# Patient Record
Sex: Male | Born: 1985 | Race: White | Hispanic: No | Marital: Married | Smoking: Never smoker
Health system: Southern US, Community
[De-identification: ages and names within clinical notes are randomized; demographics above are authoritative.]

## PROBLEM LIST (undated history)

## (undated) DIAGNOSIS — F909 Attention-deficit hyperactivity disorder, unspecified type: Secondary | ICD-10-CM

## (undated) HISTORY — PX: OTHER SURGICAL HISTORY: SHX169

## (undated) HISTORY — DX: Attention-deficit hyperactivity disorder, unspecified type: F90.9

---
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
## 2009-01-08 ENCOUNTER — Inpatient Hospital Stay (HOSPITAL_COMMUNITY): Admission: EM | Admit: 2009-01-08 | Discharge: 2009-01-09 | Payer: Self-pay | Admitting: Emergency Medicine

## 2009-07-11 ENCOUNTER — Emergency Department (HOSPITAL_COMMUNITY): Admission: EM | Admit: 2009-07-11 | Discharge: 2009-07-11 | Payer: Self-pay | Admitting: Emergency Medicine

## 2009-07-15 ENCOUNTER — Ambulatory Visit (HOSPITAL_COMMUNITY): Admission: RE | Admit: 2009-07-15 | Discharge: 2009-07-16 | Payer: Self-pay | Admitting: Orthopaedic Surgery

## 2010-06-17 ENCOUNTER — Encounter: Payer: Self-pay | Admitting: Emergency Medicine
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
## 2010-08-16 LAB — CBC
HCT: 46.8 % (ref 39.0–52.0)
Hemoglobin: 16.5 g/dL (ref 13.0–17.0)
MCV: 93.8 fL (ref 78.0–100.0)
Platelets: 178 10*3/uL (ref 150–400)
WBC: 5.1 10*3/uL (ref 4.0–10.5)

## 2010-09-03 LAB — CBC
HCT: 40.6 % (ref 39.0–52.0)
MCHC: 35.2 g/dL (ref 30.0–36.0)
MCV: 93.9 fL (ref 78.0–100.0)
RBC: 4.32 MIL/uL (ref 4.22–5.81)

## 2010-09-03 LAB — DIFFERENTIAL
Basophils Relative: 1 % (ref 0–1)
Eosinophils Absolute: 0.1 10*3/uL (ref 0.0–0.7)
Eosinophils Relative: 1 % (ref 0–5)
Lymphs Abs: 2.1 10*3/uL (ref 0.7–4.0)
Monocytes Absolute: 0.5 10*3/uL (ref 0.1–1.0)
Monocytes Relative: 6 % (ref 3–12)
Neutrophils Relative %: 67 % (ref 43–77)

## 2010-09-03 LAB — POCT I-STAT, CHEM 8
BUN: 11 mg/dL (ref 6–23)
Calcium, Ion: 1.11 mmol/L — ABNORMAL LOW (ref 1.12–1.32)
Chloride: 106 mEq/L (ref 96–112)
Creatinine, Ser: 0.9 mg/dL (ref 0.4–1.5)
Glucose, Bld: 119 mg/dL — ABNORMAL HIGH (ref 70–99)
Potassium: 3.7 mEq/L (ref 3.5–5.1)

## 2010-09-03 LAB — PROTIME-INR: INR: 1 (ref 0.00–1.49)
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
## 2010-10-19 ENCOUNTER — Inpatient Hospital Stay (HOSPITAL_COMMUNITY): Payer: 59

## 2010-10-19 ENCOUNTER — Inpatient Hospital Stay (HOSPITAL_COMMUNITY)
Admission: EM | Admit: 2010-10-19 | Discharge: 2010-10-20 | DRG: 497 | Disposition: A | Discharge status: Home / Self Care | Payer: 59 | Attending: Orthopaedic Surgery | Admitting: Orthopaedic Surgery

## 2010-10-19 ENCOUNTER — Emergency Department (HOSPITAL_COMMUNITY): Payer: 59

## 2010-10-19 DIAGNOSIS — S52309A Unspecified fracture of shaft of unspecified radius, initial encounter for closed fracture: Principal | ICD-10-CM | POA: Diagnosis present

## 2010-10-19 DIAGNOSIS — Y998 Other external cause status: Secondary | ICD-10-CM

## 2010-10-19 LAB — APTT: aPTT: 24 seconds (ref 24–37)

## 2010-10-19 LAB — CBC
HCT: 42.5 % (ref 39.0–52.0)
MCH: 32.3 pg (ref 26.0–34.0)
MCV: 91 fL (ref 78.0–100.0)
Platelets: 188 10*3/uL (ref 150–400)
RDW: 12.5 % (ref 11.5–15.5)
WBC: 12.2 10*3/uL — ABNORMAL HIGH (ref 4.0–10.5)

## 2010-10-19 LAB — COMPREHENSIVE METABOLIC PANEL
AST: 21 U/L (ref 0–37)
Albumin: 4 g/dL (ref 3.5–5.2)
Alkaline Phosphatase: 50 U/L (ref 39–117)
BUN: 10 mg/dL (ref 6–23)
CO2: 29 mEq/L (ref 19–32)
Chloride: 106 mEq/L (ref 96–112)
Creatinine, Ser: 0.7 mg/dL (ref 0.4–1.5)
GFR calc non Af Amer: >60 mL/min (ref >60)
Potassium: 4 mEq/L (ref 3.5–5.1)
Total Bilirubin: 0.2 mg/dL — ABNORMAL LOW (ref 0.3–1.2)

## 2010-10-19 LAB — DIFFERENTIAL
Eosinophils Absolute: 0 10*3/uL (ref 0.0–0.7)
Eosinophils Relative: 0 % (ref 0–5)
Lymphocytes Relative: 12 % (ref 12–46)
Lymphs Abs: 1.5 10*3/uL (ref 0.7–4.0)
Monocytes Absolute: 0.6 10*3/uL (ref 0.1–1.0)
Monocytes Relative: 5 % (ref 3–12)
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
## 2011-01-04 IMAGING — CR DG FOREARM 2V*R*
2 series · 2 of 2 positions shown · non-contrast
Comparison: 01/08/2009

CLINICAL DATA: Fall with right forearm pain.  History of previous
forearm fracture.

RIGHT FOREARM - 2 VIEW

[x forearm ap right]
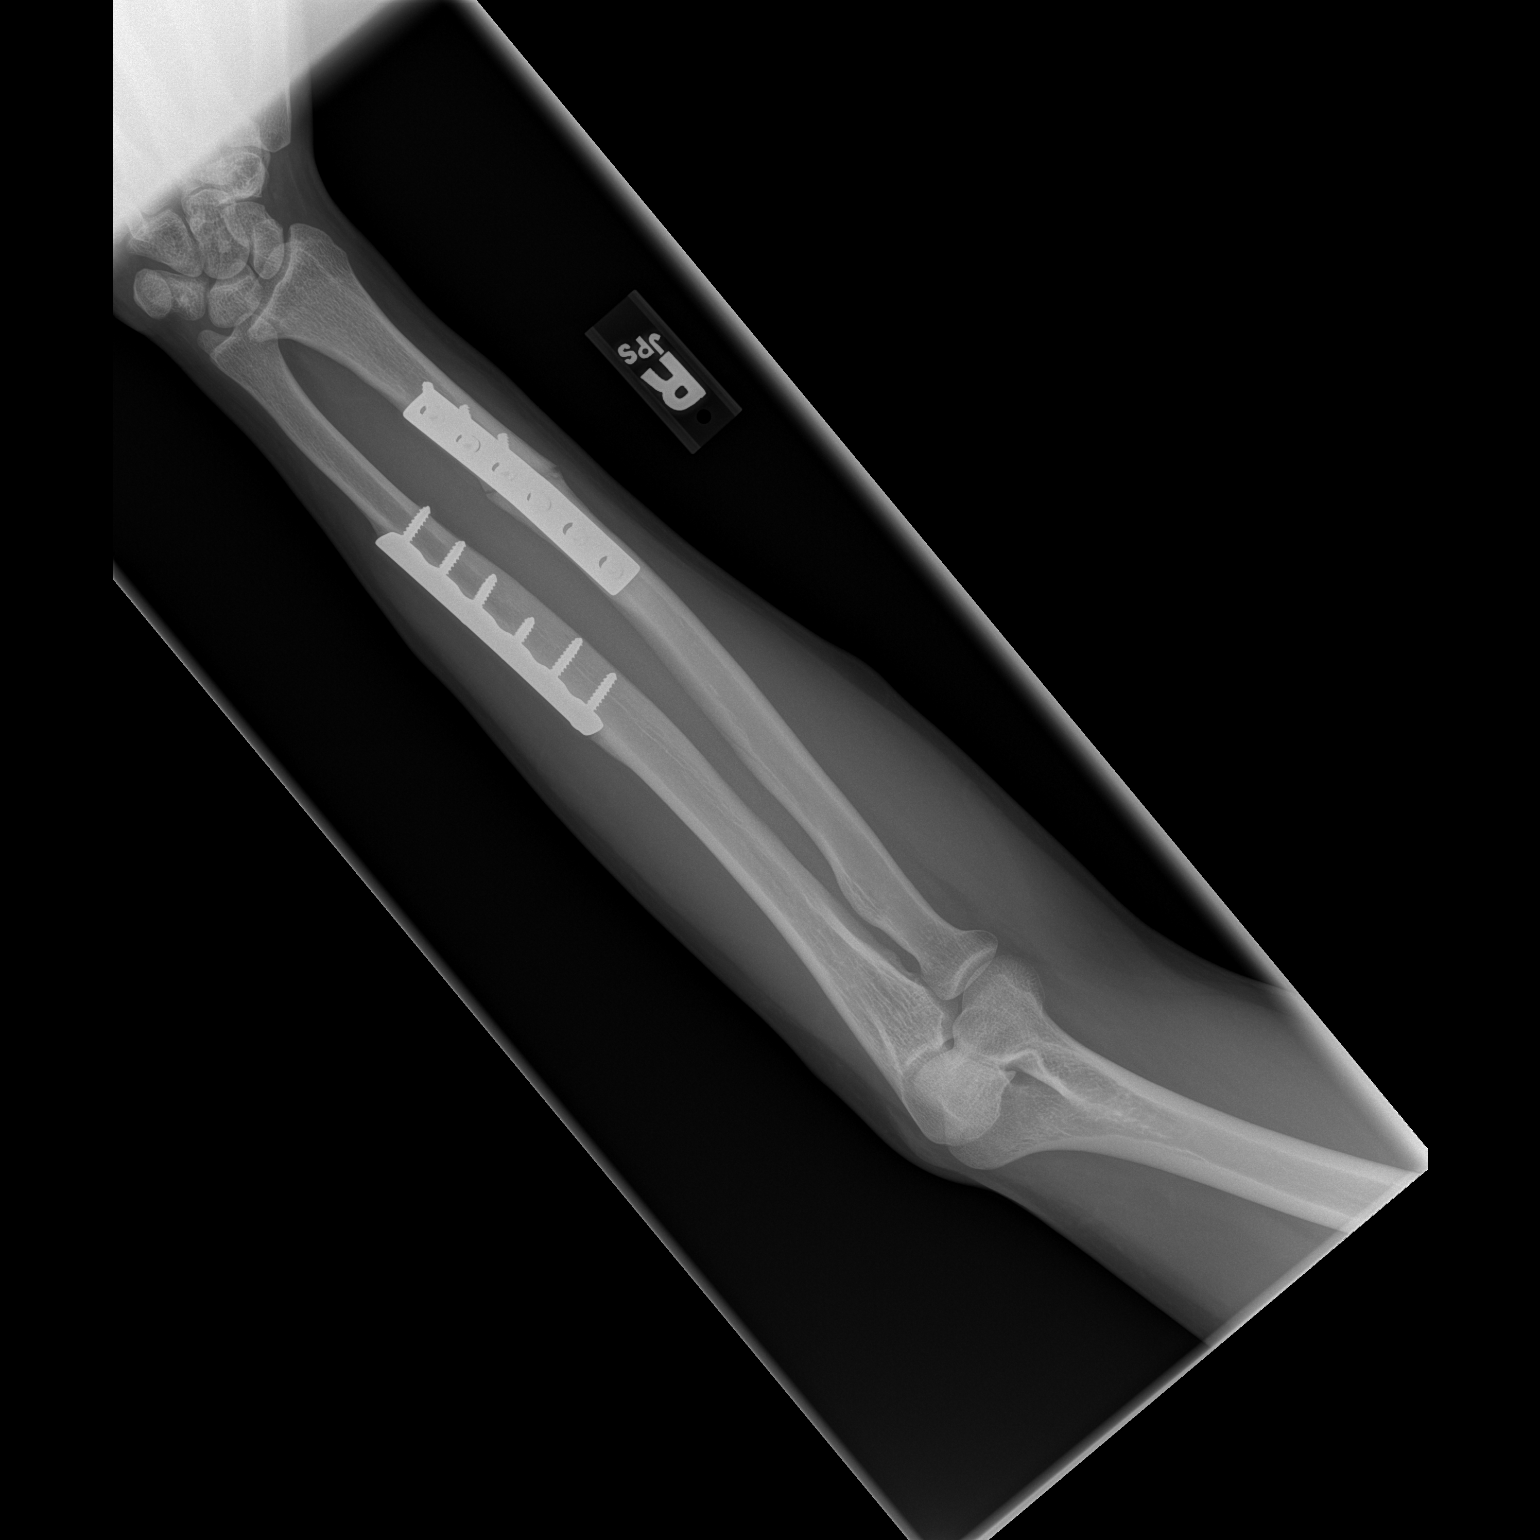

[x forearm lat right]
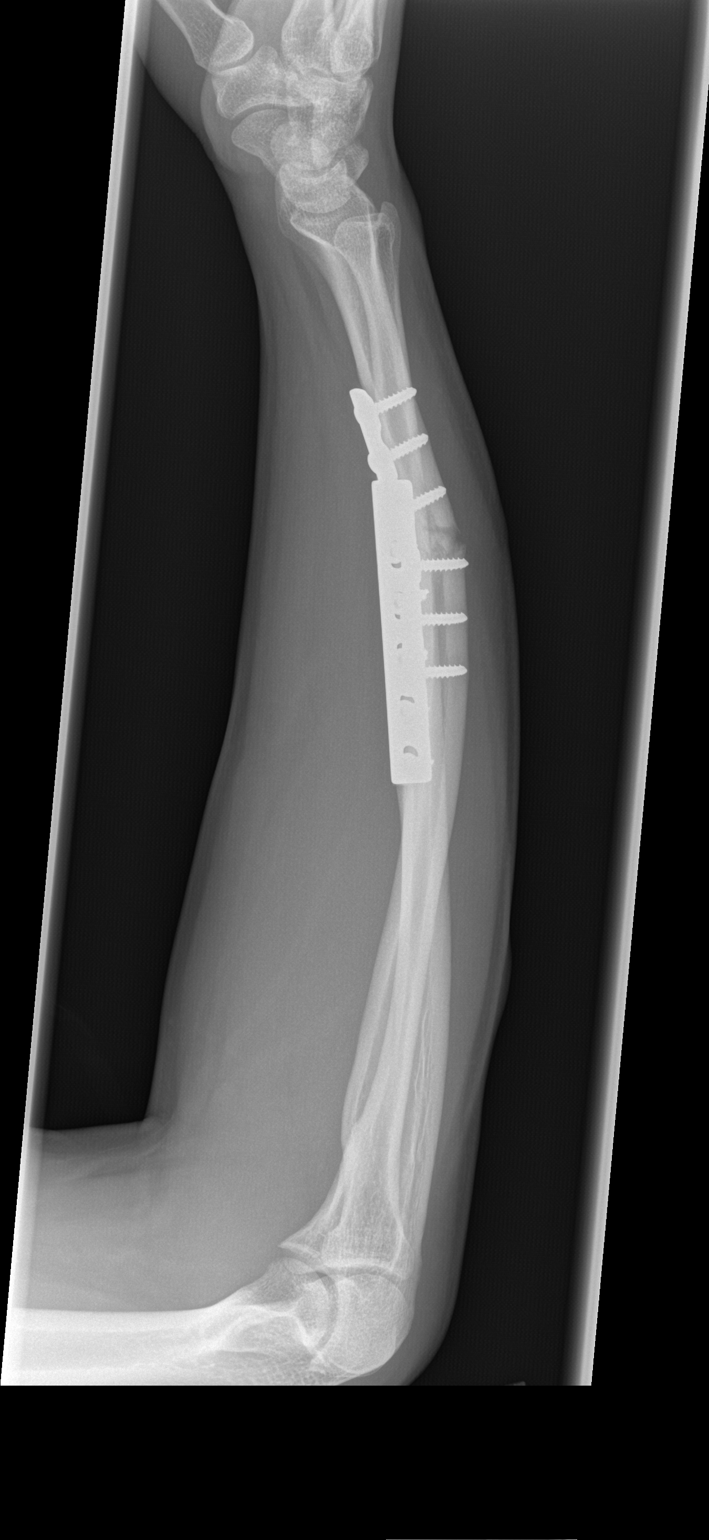

[2 of 2 positions shown; findings below may reference images not displayed]

FINDINGS: Internal plate screw fixation devices overlie the mid -
distal radius and ulna.
A fracture line is present through the mid - distal radius.
There is no evidence of subluxation or dislocation.
IMPRESSION: Mid - distal radial fracture with overlying internal plate and
screw fixation.  This fracture line may represent an ununited
fracture, but a new/acute fracture component is difficult to
exclude.  Correlate clinically.

## 2011-03-14 ENCOUNTER — Ambulatory Visit (HOSPITAL_COMMUNITY): Payer: 59 | Admitting: Physician Assistant

## 2011-03-14 DIAGNOSIS — F909 Attention-deficit hyperactivity disorder, unspecified type: Secondary | ICD-10-CM

## 2011-05-15 ENCOUNTER — Other Ambulatory Visit (HOSPITAL_COMMUNITY): Payer: Self-pay | Admitting: Physician Assistant

## 2011-05-15 ENCOUNTER — Other Ambulatory Visit (HOSPITAL_COMMUNITY): Payer: Self-pay | Admitting: Psychiatry

## 2011-05-15 DIAGNOSIS — F902 Attention-deficit hyperactivity disorder, combined type: Secondary | ICD-10-CM

## 2011-05-15 MED ORDER — LISDEXAMFETAMINE DIMESYLATE 40 MG PO CAPS: 40.0000 mg | ORAL_CAPSULE | ORAL | Status: DC

## 2011-05-16 ENCOUNTER — Encounter (HOSPITAL_COMMUNITY): Payer: 59 | Admitting: Physician Assistant

## 2011-06-20 ENCOUNTER — Ambulatory Visit (INDEPENDENT_AMBULATORY_CARE_PROVIDER_SITE_OTHER): Payer: 59 | Admitting: Physician Assistant

## 2011-06-20 DIAGNOSIS — F988 Other specified behavioral and emotional disorders with onset usually occurring in childhood and adolescence: Secondary | ICD-10-CM

## 2011-06-20 MED ORDER — METHYLPHENIDATE HCL 20 MG PO TABS: 20.0000 mg | ORAL_TABLET | Freq: Every day | ORAL | Status: DC | PRN

## 2011-06-20 NOTE — Progress Notes (Signed)
   [facility] Behavioral Health Follow-up Outpatient Visit  Gabriel Klein 1986/01/13  Date: 06/20/11   Subjective: Gabriel Klein presents today to followup on his medications prescribed for ADHD. At his last visit. I prescribed Vyvanse 40 mg to be taken daily as needed, and Ritalin 20 mg to be taken on days that he does not take Vyvanse, but needs a shorter acting stimulant medication. He reports that he is pleased with both medications. He states that the Vyvanse lasts a long time and feels that the dose is correct. He uses the Ritalin only on occasion and made 30 tablets last approximately 3 months. He denies any side effects that are bothersome. He denies any mood disturbance or any problems with his appetite and sleep.  There were no vitals filed for this visit.  Mental Status Examination  Appearance: Well groomed, dressed in scrubs Alert: Yes Attention: good  Cooperative: Yes Eye Contact: Good Speech: Clear and even Psychomotor Activity: Normal Memory/Concentration: Intact Oriented: person, place, time/date and situation Mood: Euthymic Affect: Appropriate Thought Processes and Associations: Coherent, Goal Directed and Intact Fund of Knowledge: Good Thought Content:  Insight: Good Judgement: Good  Diagnosis: ADHD, combined type  Treatment Plan: Continue Vyvanse 40 mg daily as needed, and Ritalin 20 mg daily as needed. Followup in 3 months.  Bobbye Reinitz, PA

## 2011-09-18 ENCOUNTER — Other Ambulatory Visit (HOSPITAL_COMMUNITY): Payer: Self-pay

## 2011-09-18 ENCOUNTER — Ambulatory Visit (INDEPENDENT_AMBULATORY_CARE_PROVIDER_SITE_OTHER): Payer: 59 | Admitting: Physician Assistant

## 2011-09-18 DIAGNOSIS — F988 Other specified behavioral and emotional disorders with onset usually occurring in childhood and adolescence: Secondary | ICD-10-CM | POA: Insufficient documentation

## 2011-09-18 DIAGNOSIS — F909 Attention-deficit hyperactivity disorder, unspecified type: Secondary | ICD-10-CM

## 2011-09-18 MED ORDER — LISDEXAMFETAMINE DIMESYLATE 40 MG PO CAPS: 40.0000 mg | ORAL_CAPSULE | ORAL | Status: DC

## 2011-09-18 MED ORDER — METHYLPHENIDATE HCL 20 MG PO TABS: 20.0000 mg | ORAL_TABLET | Freq: Every day | ORAL | Status: DC | PRN

## 2011-09-18 NOTE — Progress Notes (Signed)
   [facility] Behavioral Health Follow-up Outpatient Visit  Gabriel Klein May 15, 1986  Date: 09/18/2011  Subjective: Gabriel Klein presents today to follow up on his medications prescribed for ADHD. He reports that he uses that the Vyvanse most days and uses the Ritalin no more than 2 days per week. He reports that his sleep has improved. His appetite is good. His mood has been stable. 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 He plans to continue working as much as possible while in school. He denies any suicidal or homicidal ideation. He denies any auditory or visual hallucinations.  There were no vitals filed for this visit.  Mental Status Examination  Appearance: Well groomed and casually dressed Alert: Yes Attention: good  Cooperative: Yes Eye Contact: Good Speech: Clear and even Psychomotor Activity: Normal Memory/Concentration: Intact Oriented: person, place, time/date and situation Mood: Euthymic Affect: Appropriate Thought Processes and Associations: Linear Fund of Knowledge: Good Thought Content: Normal Insight: Good Judgement: Good  Diagnosis: ADHD, inattentive type  Treatment Plan: We will continue Vyvanse 40 mg daily, and Ritalin 20 mg daily as needed. He will followup in 3 months.  Carsynn Bethune, PA-C

## 2011-12-18 ENCOUNTER — Ambulatory Visit (INDEPENDENT_AMBULATORY_CARE_PROVIDER_SITE_OTHER): Payer: 59 | Admitting: Physician Assistant

## 2011-12-18 DIAGNOSIS — F909 Attention-deficit hyperactivity disorder, unspecified type: Secondary | ICD-10-CM

## 2011-12-18 DIAGNOSIS — F902 Attention-deficit hyperactivity disorder, combined type: Secondary | ICD-10-CM

## 2011-12-18 DIAGNOSIS — F988 Other specified behavioral and emotional disorders with onset usually occurring in childhood and adolescence: Secondary | ICD-10-CM

## 2011-12-18 MED ORDER — METHYLPHENIDATE HCL 20 MG PO TABS: 20.0000 mg | ORAL_TABLET | Freq: Every day | ORAL | Status: DC | PRN

## 2011-12-18 MED ORDER — LISDEXAMFETAMINE DIMESYLATE 40 MG PO CAPS: 40.0000 mg | ORAL_CAPSULE | ORAL | Status: DC

## 2011-12-18 NOTE — Progress Notes (Signed)
   [facility] Behavioral Health Follow-up Outpatient Visit  Gabriel Klein [birth year]  Date: 12/18/2011   Subjective: [name] presents today to followup on his treatment for her ADHD. He reports that he is "cruising through the summer." He continues to work full time, and he took one summer class  -  Satistics. In the fall he will be taking 9 credit hours, but will only have to attend school one day per week. He feels that the current dosing regimen of Vyvanse on long days, and Ritalin on short days is working well. He has some concern that on school days during the upcoming semester the Vyvanse may not last long enough. His sleep and appetite are good. His mood is stable.  There were no vitals filed for this visit.  Mental Status Examination  Appearance: Well groomed and casually dressed Alert: Yes Attention: good  Cooperative: Yes Eye Contact: Good Speech: Clear and coherent Psychomotor Activity: Normal Memory/Concentration: Intact Oriented: person, place, time/date and situation Mood: Euthymic Affect: Appropriate Thought Processes and Associations: Linear Fund of Knowledge: Good Thought Content: Normal Insight: Good Judgement: Good  Diagnosis: ADHD  Treatment Plan: We will continue the Vyvanse 40 mg daily for long days, and Ritalin 20 mg daily for short days. He will return for followup in approximately 6 weeks, and if a medication change is necessary we will discuss that. He may possibly benefit from [place].  Amelia Burgard, PA-C

## 2012-02-05 ENCOUNTER — Ambulatory Visit (HOSPITAL_COMMUNITY): Payer: 59 | Admitting: Physician Assistant

## 2012-03-24 ENCOUNTER — Ambulatory Visit (INDEPENDENT_AMBULATORY_CARE_PROVIDER_SITE_OTHER): Payer: 59 | Admitting: Physician Assistant

## 2012-03-24 ENCOUNTER — Telehealth (HOSPITAL_COMMUNITY): Payer: Self-pay | Admitting: *Deleted

## 2012-03-24 DIAGNOSIS — F988 Other specified behavioral and emotional disorders with onset usually occurring in childhood and adolescence: Secondary | ICD-10-CM

## 2012-03-24 DIAGNOSIS — F902 Attention-deficit hyperactivity disorder, combined type: Secondary | ICD-10-CM

## 2012-03-24 MED ORDER — LISDEXAMFETAMINE DIMESYLATE 40 MG PO CAPS: 40.0000 mg | ORAL_CAPSULE | ORAL | Status: DC

## 2012-03-24 MED ORDER — METHYLPHENIDATE HCL 20 MG PO TABS: 20.0000 mg | ORAL_TABLET | Freq: Every day | ORAL | Status: DC | PRN

## 2012-03-24 NOTE — Telephone Encounter (Signed)
Corrected medication "Association" at direction of provider-Alan Watt, PA-C

## 2012-03-24 NOTE — Progress Notes (Signed)
   [facility] Behavioral Health Follow-up Outpatient Visit  Lucien Budney Oct 09, 1985  Date: 03/24/2012   Subjective: Garrie presents today to followup on his treatment for ADHD. He reports that he continues to do very well. There are some long days where he takes Vyvanse in the morning and takes a Ritalin in the evening to help him get through. 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 He reports that his mood is stable. He is sleeping well. His appetite is good. He denies any suicidal or homicidal ideation. He denies any auditory or visual hallucinations.  There were no vitals filed for this visit.  Mental Status Examination  Appearance: Casual Alert: Yes Attention: good  Cooperative: Yes Eye Contact: Good Speech: Clear and coherent Psychomotor Activity: Normal Memory/Concentration: Intact Oriented: person, place, time/date and situation Mood: Euthymic Affect: Appropriate Thought Processes and Associations: Linear Fund of Knowledge: Good Thought Content: Normal Insight: Good Judgement: Good  Diagnosis: ADHD inattentive type  Treatment Plan: We will continue his Vyvanse 40 mg daily, and Ritalin 20 mg daily as needed. He will return for followup in 3 months.  Dyon Rotert, PA-C

## 2012-04-13 IMAGING — CR DG FOREARM 2V*R*
2 series · 2 of 2 positions shown · non-contrast
Comparison: Forearm films 10/19/2010 at [DATE] a.m.

CLINICAL DATA: Status post ORIF.

RIGHT FOREARM - 2 VIEW

[AP]
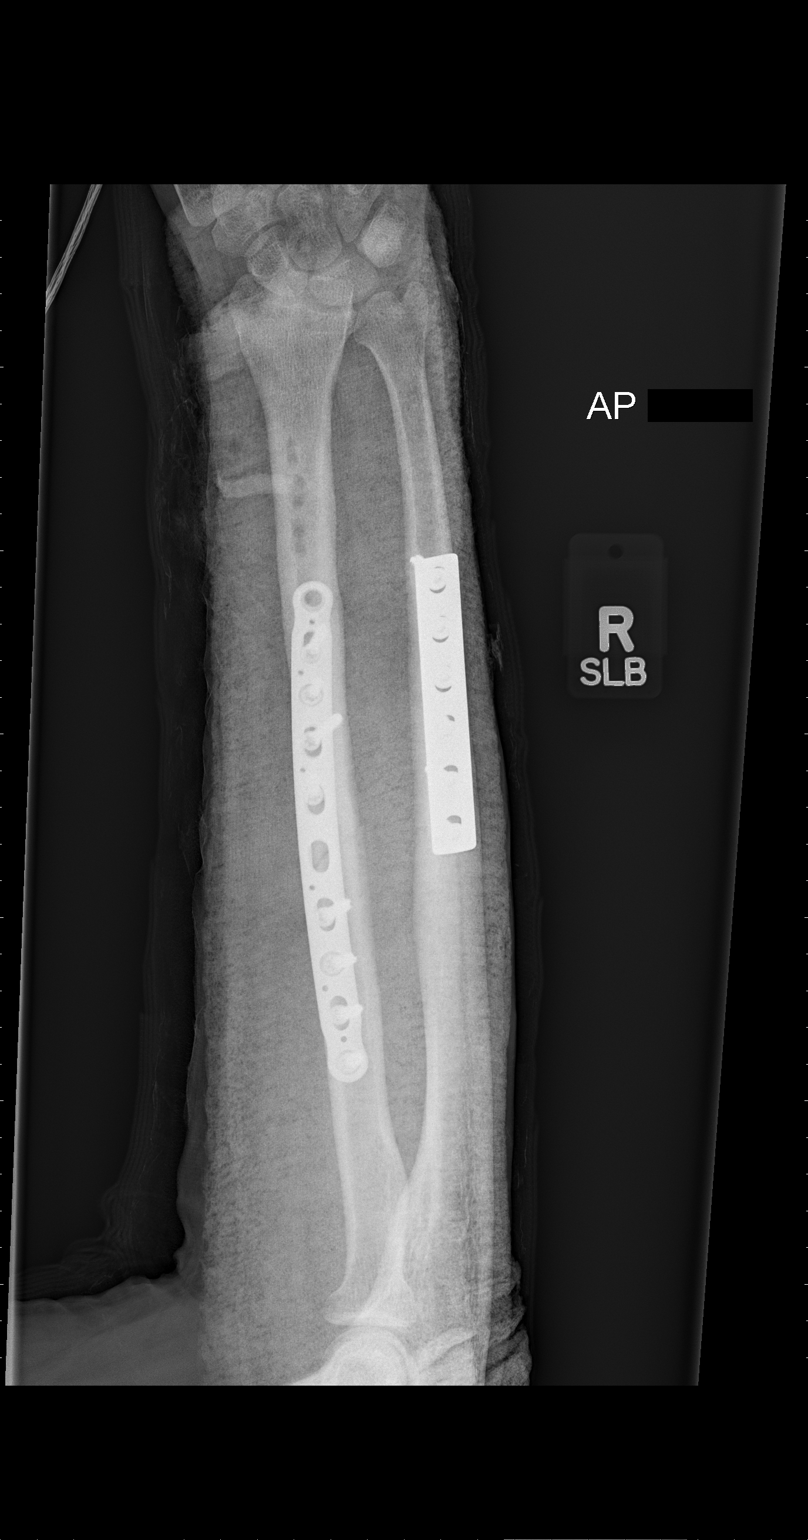

[forearm lat]
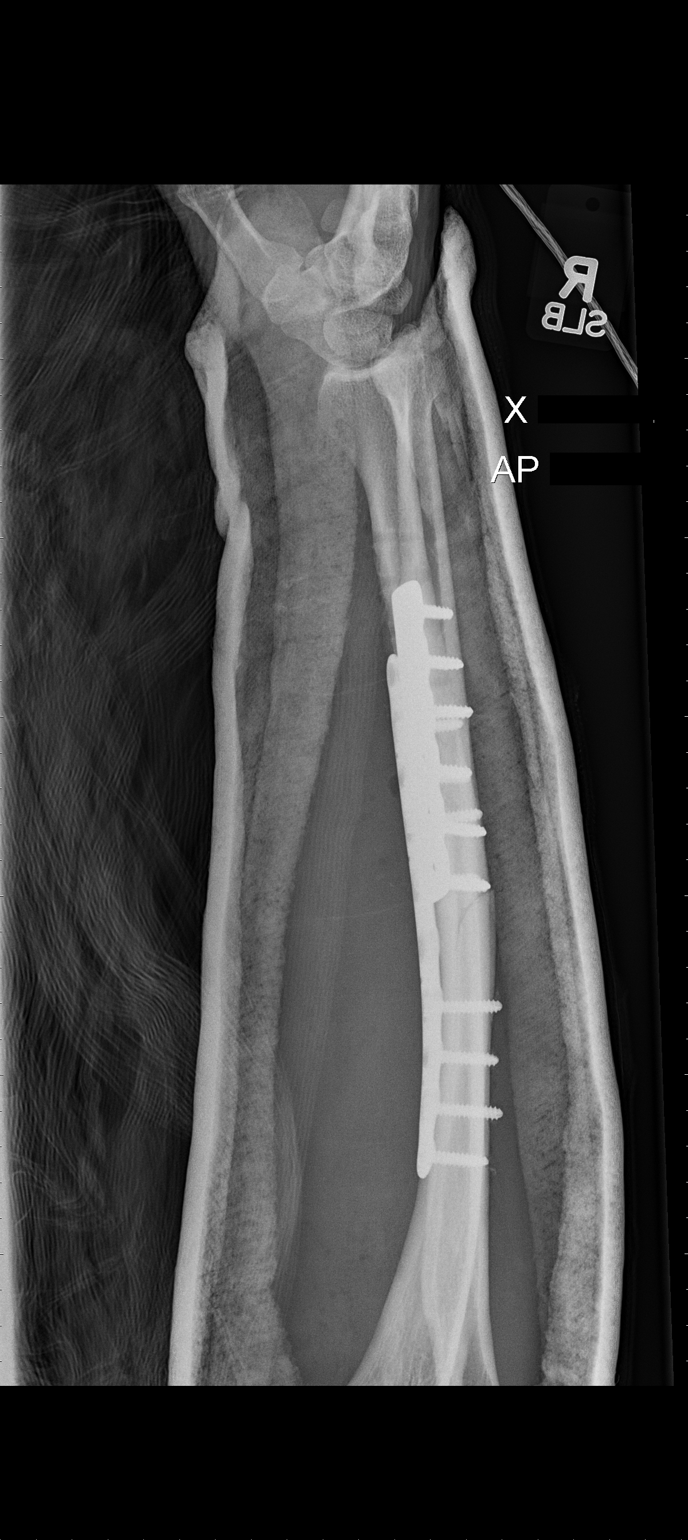

[2 of 2 positions shown; findings below may reference images not displayed]

FINDINGS: The patient is now status post revision of the radial
fixation.  New plate and screw fixation has been performed.  The
fracture is reduced.  The wrist and elbow are located.  Some bone
detail is obscured by the splint material.
IMPRESSION: 1.  Revision ORIF of the radial fracture with satisfactory anatomic
reduction.
2.  Stable ORIF of the ulna.

## 2012-05-29 ENCOUNTER — Ambulatory Visit (HOSPITAL_COMMUNITY): Payer: Self-pay | Admitting: Physician Assistant

## 2012-06-26 ENCOUNTER — Other Ambulatory Visit (HOSPITAL_COMMUNITY): Payer: Self-pay | Admitting: *Deleted

## 2012-06-26 ENCOUNTER — Ambulatory Visit (INDEPENDENT_AMBULATORY_CARE_PROVIDER_SITE_OTHER): Payer: BC Managed Care – PPO | Admitting: Physician Assistant

## 2012-06-26 DIAGNOSIS — F988 Other specified behavioral and emotional disorders with onset usually occurring in childhood and adolescence: Secondary | ICD-10-CM

## 2012-06-26 MED ORDER — METHYLPHENIDATE HCL 20 MG PO TABS: 20.0000 mg | ORAL_TABLET | Freq: Every day | ORAL | Status: DC | PRN

## 2012-06-26 MED ORDER — LISDEXAMFETAMINE DIMESYLATE 40 MG PO CAPS: 40.0000 mg | ORAL_CAPSULE | ORAL | Status: DC

## 2012-06-26 NOTE — Progress Notes (Signed)
   [facility] Behavioral Health Follow-up Outpatient Visit  Gabriel Klein July 20, 1985  Date: 06/26/2012   Subjective: Gabriel Klein presents today to followup on his treatment for ADHD. He reports that he continues to do well. 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 He continues to work full-time as well. He feels that the Vyvanse at 40 mg is working well, and he uses the Ritalin 20 mg only on occasion. His mood has been stable. His sleep and appetite are good.  There were no vitals filed for this visit.  Mental Status Examination  Appearance: Casual Alert: Yes Attention: good  Cooperative: Yes Eye Contact: Good Speech: Clear and coherent Psychomotor Activity: Normal Memory/Concentration: Intact Oriented: person, place, time/date and situation Mood: Euthymic Affect: Appropriate Thought Processes and Associations: Linear Fund of Knowledge: Good Thought Content: Normal Insight: Good Judgement: Good  Diagnosis: ADHD, inattentive type  Treatment Plan: We will continue his Vyvanse 40 mg daily, and Ritalin 20 mg daily as needed. He'll return for followup in 3 months.  Nevan Creighton, PA-C

## 2012-09-25 ENCOUNTER — Ambulatory Visit (HOSPITAL_COMMUNITY): Payer: Self-pay | Admitting: Physician Assistant

## 2012-09-29 ENCOUNTER — Ambulatory Visit (HOSPITAL_COMMUNITY): Payer: Self-pay | Admitting: Physician Assistant

## 2012-11-11 ENCOUNTER — Ambulatory Visit (INDEPENDENT_AMBULATORY_CARE_PROVIDER_SITE_OTHER): Payer: BC Managed Care – PPO | Admitting: Physician Assistant

## 2012-11-11 VITALS — BP 122/75 | HR 60 | Wt 161.0 lb

## 2012-11-11 DIAGNOSIS — F988 Other specified behavioral and emotional disorders with onset usually occurring in childhood and adolescence: Secondary | ICD-10-CM

## 2012-11-11 MED ORDER — METHYLPHENIDATE HCL 20 MG PO TABS: 20.0000 mg | ORAL_TABLET | Freq: Every day | ORAL | Status: DC | PRN

## 2012-11-13 ENCOUNTER — Encounter (HOSPITAL_COMMUNITY): Payer: Self-pay | Admitting: Physician Assistant

## 2012-11-13 NOTE — Progress Notes (Signed)
   [facility] Behavioral Health Follow-up Outpatient Visit  Gabriel Klein 11/28/1985  Date: 11/11/2012   Subjective: Gabriel Klein presents today to followup on his treatment for ADHD. He is currently out of school for the summer. He has 3 more semesters for graduation. This summer he plans to work a lot on his job. 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 When he returns to school next semester, he will need to work on a part-time basis due to the demands of his school schedule. He feels that the medication is working well and he denies any side effects.  Filed Vitals:   11/13/12 0956  BP: 122/75  Pulse: 60    Mental Status Examination  Appearance: Casual Alert: Yes Attention: good  Cooperative: Yes Eye Contact: Good Speech: Clear and coherent Psychomotor Activity: Normal Memory/Concentration: Intact Oriented: person, place, time/date and situation Mood: Euthymic Affect: Appropriate Thought Processes and Associations: Linear Fund of Knowledge: Good Thought Content: Normal Insight: Good Judgement: Good  Diagnosis: ADHD, inattentive type  Treatment Plan: We will continue his Vyvanse 40 mg daily, and Ritalin 20 mg daily as needed. He will return for followup visit in 3 months.  Destin Kittler, PA-C

## 2012-11-17 ENCOUNTER — Other Ambulatory Visit (HOSPITAL_COMMUNITY): Payer: Self-pay | Admitting: *Deleted

## 2012-11-17 DIAGNOSIS — F988 Other specified behavioral and emotional disorders with onset usually occurring in childhood and adolescence: Secondary | ICD-10-CM

## 2012-11-17 MED ORDER — LISDEXAMFETAMINE DIMESYLATE 40 MG PO CAPS: 40.0000 mg | ORAL_CAPSULE | ORAL | Status: DC

## 2012-11-17 NOTE — Telephone Encounter (Signed)
Per Jorje Guild, PA, pt needs 90 day prescription for Vyvanse

## 2012-11-25 ENCOUNTER — Telehealth (HOSPITAL_COMMUNITY): Payer: Self-pay

## 2012-11-25 NOTE — Telephone Encounter (Signed)
11/25/12 3:42pm Patient came and pick-up rx script.Marland KitchenMarguerite Olea

## 2013-02-17 ENCOUNTER — Ambulatory Visit (INDEPENDENT_AMBULATORY_CARE_PROVIDER_SITE_OTHER): Payer: BC Managed Care – PPO | Admitting: Physician Assistant

## 2013-02-17 ENCOUNTER — Encounter (HOSPITAL_COMMUNITY): Payer: Self-pay | Admitting: Physician Assistant

## 2013-02-17 VITALS — BP 132/76 | HR 58 | Ht 74.0 in | Wt 159.8 lb

## 2013-02-17 DIAGNOSIS — F988 Other specified behavioral and emotional disorders with onset usually occurring in childhood and adolescence: Secondary | ICD-10-CM

## 2013-02-17 MED ORDER — METHYLPHENIDATE HCL 20 MG PO TABS: 20.0000 mg | ORAL_TABLET | Freq: Every day | ORAL | Status: DC | PRN

## 2013-02-17 MED ORDER — LISDEXAMFETAMINE DIMESYLATE 40 MG PO CAPS: 40.0000 mg | ORAL_CAPSULE | ORAL | Status: DC

## 2013-02-17 NOTE — Progress Notes (Signed)
   [facility] Behavioral Health Follow-up Outpatient Visit  Gabriel Klein [birth year]  Date: 02/17/2013   Subjective: Gabriel Klein presents today to followup on his treatment for ADHD. He reports that he is back in school and doing clinical rotations as well as classwork, and working in the emergency department 2 days per week. 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 He feels that the medication is working well. He alternates between the Vyvanse and irregular and depending on what type of day he is expecting.  Filed Vitals:   02/17/13 1520  BP: 132/76  Pulse: 58    Mental Status Examination  Appearance: Casual Alert: Yes Attention: good  Cooperative: Yes Eye Contact: Good Speech: Clear and coherent Psychomotor Activity: Normal Memory/Concentration: Intact Oriented: person, place, time/date and situation Mood: Euthymic Affect: Appropriate Thought Processes and Associations: Linear Fund of Knowledge: Good Thought Content: Normal Insight: Good Judgement: Good  Diagnosis: ADHD, inattentive type  Treatment Plan: We will continue his Vyvanse 40 mg daily and Ritalin 20 mg daily, and he will return for followup in approximately 4-5 months.  Gabriel Nocera, PA-C

## 2013-06-02 ENCOUNTER — Ambulatory Visit (INDEPENDENT_AMBULATORY_CARE_PROVIDER_SITE_OTHER): Payer: BC Managed Care – PPO | Admitting: Psychiatry

## 2013-06-02 ENCOUNTER — Encounter (HOSPITAL_COMMUNITY): Payer: Self-pay | Admitting: Psychiatry

## 2013-06-02 VITALS — BP 126/78 | HR 57 | Ht 74.0 in | Wt 167.0 lb

## 2013-06-02 DIAGNOSIS — Z79899 Other long term (current) drug therapy: Secondary | ICD-10-CM

## 2013-06-02 DIAGNOSIS — F988 Other specified behavioral and emotional disorders with onset usually occurring in childhood and adolescence: Secondary | ICD-10-CM

## 2013-06-02 MED ORDER — LISDEXAMFETAMINE DIMESYLATE 40 MG PO CAPS: 40.0000 mg | ORAL_CAPSULE | ORAL | Status: DC

## 2013-06-02 NOTE — Progress Notes (Signed)
[facility] [id number] Progress Note  Anne FuLarry Kalata [id number] 28 y.o.  06/02/2013 2:39 PM  Chief Complaint: I need my medication.  History of Present Illness: Patient is a 28 year old Caucasian employed male who has been seen in this office since October 2012.  He was seeing physician assistant Jorje GuildAlan Watt who left the practice.  This is the first time I am seeing this patient.  Patient has history of ADD.  He is taking Vyvanse 40 mg and sometimes takes Ritalin 20 mg as needed.  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  He is also enrolled in a school to become a Publishing rights managernurse practitioner.  He admitted that stimulant is helping very well.  He is able to do multitasking.  He admitted drinking one to 2 times a week but denies any binge drinking.  He is aware about interaction with drinking and the stimulants.  Patient told that he use to be a heavy drinker but since he is taking a stimulant he has significantly cut down his drinking.  Patient had a good Christmas.  He visited his parents who lives in [place], [place].  Patient's mother is also a Engineer, civil (consulting)nurse and working with [employer].  Patient denies any agitation, anger or any mood swings.  He denies any insomnia or any paranoia.  He has no tremors or shakes.  He is hoping to graduate next December.  Patient denies any illegal substances.  Suicidal Ideation: No Plan Formed: No Patient has means to carry out plan: No  Homicidal Ideation: No Plan Formed: No Patient has means to carry out plan: No  Review of Systems: Psychiatric: Agitation: No Hallucination: No Depressed Mood: No Insomnia: No Hypersomnia: No Altered Concentration: No Feels Worthless: No Grandiose Ideas: No Belief In Special Powers: No New/Increased Substance Abuse: No Compulsions: No  Neurologic: Headache: No Seizure: No Paresthesias: No  Past Medical Family, Social History:  Patient has no active medical problems.  He has no primary  care physician.  He lives by himself.  He has a place in [place] and also he comes in to stay with his parents.  Patient is single and he has no children.  Patient denies any family history of psychiatric illness.  He is the only child of his parents.  Outpatient Encounter Prescriptions as of 06/02/2013  Medication Sig  . lisdexamfetamine (VYVANSE) 40 MG capsule Take 1 capsule (40 mg total) by mouth every morning.  . methylphenidate (RITALIN) 20 MG tablet Take 1 tablet (20 mg total) by mouth daily as needed.  . [DISCONTINUED] lisdexamfetamine (VYVANSE) 40 MG capsule Take 1 capsule (40 mg total) by mouth every morning.    Past Psychiatric History/Hospitalization(s): Anxiety: No Bipolar Disorder: No Depression: No Mania: No Psychosis: No Schizophrenia: No Personality Disorder: No Hospitalization for psychiatric illness: No History of Electroconvulsive Shock Therapy: No Prior Suicide Attempts: No  Physical Exam: Constitutional:  BP 126/78  Pulse 57  Ht 6\' 2"  (1.88 m)  Wt 167 lb (75.751 kg)  BMI 21.43 kg/m2  General Appearance: well nourished  Musculoskeletal: Strength & Muscle Tone: within normal limits Gait & Station: normal Patient leans: N/A  Psychiatric: Speech (describe rate, volume, coherence, spontaneity, and abnormalities if any): Clear and coherent.  Fast  but normal tone and volume.  Thought Process (describe rate, content, abstract reasoning, and computation): Logical and goal directed.  Associations: Coherent and Intact  Thoughts: normal  Mental Status: Orientation: oriented to person, place, time/date, situation, day of week,  month of year and year Mood & Affect: anxiety Attention Span & Concentration: Fair  Medical Decision Making (Choose Three): Established Problem, Stable/Improving (1), Review of Psycho-Social Stressors (1), Review or order clinical lab tests (1), Decision to obtain old records (1), Review and summation of old records (2), Review of Last  Therapy Session (1), Review of Medication Regimen & Side Effects (2) and Review of New Medication or Change in Dosage (2)  Assessment: Axis I: ADD  Axis II: Deferred  Axis III: No active medical problems  Axis IV: Mild  Axis V: 75-80   Plan: I reviewed the symptoms, collateral information, his current medication.  We also talked about stopping alcohol completely.  We talked about interaction of alcohol with psychotropic medication.  Patient is studying nurse practitioner, he is a aware of side effects.  I also explained that he does not require to stimulants.  Patient agreed.  I will continue Vyvanse 40 mg daily.  I will discontinue Ritalin since patient does not take regularly and does not require a second a stimulant.  I will also order a CBC, CMP hemoglobin A1c.  Patient has no blood work in past 2 years.  Recommended to call us back if he has any question or any concern.  Followup in 3 months.Time spent 25 minutes.  More than 50% of the time spent in psychoeducation, counseling and coordination of care.  Discuss safety plan that anytime having active suicidal thoughts or homicidal thoughts then patient need to call 911 or go to the local emergency room.    Irisa Grimsley T., MD 06/02/2013

## 2013-06-22 ENCOUNTER — Ambulatory Visit (HOSPITAL_COMMUNITY): Payer: Self-pay | Admitting: Physician Assistant

## 2013-08-13 LAB — COMPREHENSIVE METABOLIC PANEL
ALBUMIN: 4.8 g/dL (ref 3.5–5.2)
ALT: 25 U/L (ref 0–53)
AST: 21 U/L (ref 0–37)
Alkaline Phosphatase: 53 U/L (ref 39–117)
BUN: 11 mg/dL (ref 6–23)
CO2: 28 meq/L (ref 19–32)
Calcium: 9.9 mg/dL (ref 8.4–10.5)
Chloride: 100 mEq/L (ref 96–112)
Creat: 0.8 mg/dL (ref 0.50–1.35)
GLUCOSE: 100 mg/dL — AB (ref 70–99)
POTASSIUM: 4.5 meq/L (ref 3.5–5.3)
SODIUM: 136 meq/L (ref 135–145)
TOTAL PROTEIN: 7.9 g/dL (ref 6.0–8.3)
Total Bilirubin: 0.5 mg/dL (ref 0.2–1.2)

## 2013-08-13 LAB — HEMOGLOBIN A1C
Hgb A1c MFr Bld: 5.5 % (ref <5.7)
Mean Plasma Glucose: 111 mg/dL (ref <117)

## 2013-08-13 LAB — CBC WITH DIFFERENTIAL/PLATELET
Basophils Absolute: 0.1 10*3/uL (ref 0.0–0.1)
Basophils Relative: 1 % (ref 0–1)
EOS ABS: 0.1 10*3/uL (ref 0.0–0.7)
Eosinophils Relative: 2 % (ref 0–5)
HCT: 46.9 % (ref 39.0–52.0)
Hemoglobin: 16.1 g/dL (ref 13.0–17.0)
LYMPHS ABS: 2 10*3/uL (ref 0.7–4.0)
LYMPHS PCT: 34 % (ref 12–46)
MCH: 31.9 pg (ref 26.0–34.0)
MCHC: 34.3 g/dL (ref 30.0–36.0)
MCV: 93.1 fL (ref 78.0–100.0)
Monocytes Absolute: 0.4 10*3/uL (ref 0.1–1.0)
Monocytes Relative: 7 % (ref 3–12)
Neutro Abs: 3.4 10*3/uL (ref 1.7–7.7)
Neutrophils Relative %: 56 % (ref 43–77)
PLATELETS: 238 10*3/uL (ref 150–400)
RBC: 5.04 MIL/uL (ref 4.22–5.81)
RDW: 13.6 % (ref 11.5–15.5)
WBC: 6 10*3/uL (ref 4.0–10.5)

## 2013-09-11 ENCOUNTER — Ambulatory Visit (INDEPENDENT_AMBULATORY_CARE_PROVIDER_SITE_OTHER): Payer: BC Managed Care – PPO | Admitting: Psychiatry

## 2013-09-11 ENCOUNTER — Encounter (HOSPITAL_COMMUNITY): Payer: Self-pay | Admitting: Psychiatry

## 2013-09-11 VITALS — BP 112/84 | HR 50 | Ht 74.0 in | Wt 166.4 lb

## 2013-09-11 DIAGNOSIS — F988 Other specified behavioral and emotional disorders with onset usually occurring in childhood and adolescence: Secondary | ICD-10-CM

## 2013-09-11 MED ORDER — LISDEXAMFETAMINE DIMESYLATE 40 MG PO CAPS: 40.0000 mg | ORAL_CAPSULE | ORAL | Status: DC

## 2013-09-11 NOTE — Progress Notes (Signed)
[place] [phone] Progress Note  Gabriel Klein [id number] 27 y.o.  09/11/2013 8:58 AM  Chief Complaint: Medication management and followup.  History of Present Illness: Gabriel Klein came for his followup appointment.  He is compliant with this Vyvanse.  We have discontinue Ritalin on his last visit.  He has no issues.  He is able to do multitasking.  He is excited about his graduation which may happen in December.  Recently he has been busy studying  In trying to wrap up his semester.  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  Patient is enrolled in a school to become nurse practitioner.  Patient denies any irritability, anger, mood swing.  His sleep is good.  He denies any anger.  He denies any paranoia or any hallucinations.  Patient has significantly cut down his drinking.  He remembered drink only one time since her last visit .  He says a binge drinking.  Patient is not using any illicit substances.  He has no tremors or shakes.  He denies any crying spells.  He has blood work , he has a normal hemoglobin A1c CBC and comprehensive metabolic panel.  Patient lives in [place].  Suicidal Ideation: No Plan Formed: No Patient has means to carry out plan: No  Homicidal Ideation: No Plan Formed: No Patient has means to carry out plan: No  Review of Systems: Psychiatric: Agitation: No Hallucination: No Depressed Mood: No Insomnia: No Hypersomnia: No Altered Concentration: No Feels Worthless: No Grandiose Ideas: No Belief In Special Powers: No New/Increased Substance Abuse: No Compulsions: No  Neurologic: Headache: No Seizure: No Paresthesias: No  Past Medical Family, Social History:  Patient has no active medical problems.  He has no primary care physician.  He lives by himself.  He has a place in [place] and also he comes in to stay with his parents.  Patient is single and he has no children.  Patient denies any family history of psychiatric illness.   He is the only child of his parents.  Outpatient Encounter Prescriptions as of 09/11/2013  Medication Sig  . lisdexamfetamine (VYVANSE) 40 MG capsule Take 1 capsule (40 mg total) by mouth every morning.  . [DISCONTINUED] lisdexamfetamine (VYVANSE) 40 MG capsule Take 1 capsule (40 mg total) by mouth every morning.    Past Psychiatric History/Hospitalization(s): Anxiety: No Bipolar Disorder: No Depression: No Mania: No Psychosis: No Schizophrenia: No Personality Disorder: No Hospitalization for psychiatric illness: No History of Electroconvulsive Shock Therapy: No Prior Suicide Attempts: No  Physical Exam: Constitutional:  BP 112/84  Pulse 50  Ht 6' 2"  (1.88 m)  Wt 166 lb 6.4 oz (75.479 kg)  BMI 21.36 kg/m2  Recent Results (from the past 2160 hour(s))  HEMOGLOBIN A1C     Status: None   Collection Time    08/13/13 11:52 AM      Result Value Ref Range   Hemoglobin A1C 5.5  <5.7 %   Comment:                                                                            According to the ADA Clinical Practice Recommendations for 2011, when  HbA1c is used as a screening test:             >=6.5%   Diagnostic of Diabetes Mellitus                (if abnormal result is confirmed)           5.7-6.4%   Increased risk of developing Diabetes Mellitus           References:Diagnosis and Classification of Diabetes Mellitus,Diabetes     WIOM,3559,74(BULAG 1):S62-S69 and Standards of Medical Care in             Diabetes - 2011,Diabetes TXMI,6803,21 (Suppl 1):S11-S61.         Mean Plasma Glucose 111  <117 mg/dL  COMPREHENSIVE METABOLIC PANEL     Status: Abnormal   Collection Time    08/13/13 11:52 AM      Result Value Ref Range   Sodium 136  135 - 145 mEq/L   Potassium 4.5  3.5 - 5.3 mEq/L   Chloride 100  96 - 112 mEq/L   CO2 28  19 - 32 mEq/L   Glucose, Bld 100 (*) 70 - 99 mg/dL   BUN 11  6 - 23 mg/dL   Creat 0.80  0.50 - 1.35 mg/dL   Total Bilirubin 0.5  0.2 - 1.2 mg/dL    Alkaline Phosphatase 53  39 - 117 U/L   AST 21  0 - 37 U/L   ALT 25  0 - 53 U/L   Total Protein 7.9  6.0 - 8.3 g/dL   Albumin 4.8  3.5 - 5.2 g/dL   Calcium 9.9  8.4 - 10.5 mg/dL  CBC WITH DIFFERENTIAL     Status: None   Collection Time    08/13/13 11:52 AM      Result Value Ref Range   WBC 6.0  4.0 - 10.5 K/uL   RBC 5.04  4.22 - 5.81 MIL/uL   Hemoglobin 16.1  13.0 - 17.0 g/dL   HCT 46.9  39.0 - 52.0 %   MCV 93.1  78.0 - 100.0 fL   MCH 31.9  26.0 - 34.0 pg   MCHC 34.3  30.0 - 36.0 g/dL   RDW 13.6  11.5 - 15.5 %   Platelets 238  150 - 400 K/uL   Neutrophils Relative % 56  43 - 77 %   Neutro Abs 3.4  1.7 - 7.7 K/uL   Lymphocytes Relative 34  12 - 46 %   Lymphs Abs 2.0  0.7 - 4.0 K/uL   Monocytes Relative 7  3 - 12 %   Monocytes Absolute 0.4  0.1 - 1.0 K/uL   Eosinophils Relative 2  0 - 5 %   Eosinophils Absolute 0.1  0.0 - 0.7 K/uL   Basophils Relative 1  0 - 1 %   Basophils Absolute 0.1  0.0 - 0.1 K/uL   Smear Review Criteria for review not met      General Appearance: well nourished  Musculoskeletal: Strength & Muscle Tone: within normal limits Gait & Station: normal Patient leans: N/A  Psychiatric: Speech (describe rate, volume, coherence, spontaneity, and abnormalities if any): Clear and coherent.  Fast  but normal tone and volume.  Thought Process (describe rate, content, abstract reasoning, and computation): Logical and goal directed.  Associations: Coherent and Intact  Thoughts: normal  Mental Status: Orientation: oriented to person, place, time/date, situation, day of week, month of year and year Mood & Affect: anxiety Attention Span &  Concentration: Fair  Established Problem, Stable/Improving (1), Review of Psycho-Social Stressors (1), Review or order clinical lab tests (1), Review of Last Therapy Session (1) and Review of Medication Regimen & Side Effects (2)  Assessment: Axis I: ADD  Axis II: Deferred  Axis III: No active medical problems  Axis  IV: Mild  Axis V: 75-80   Plan:  I review his blood results and his current medication.  Patient is doing much better on his Vyvanse 40 mg.  He does not ask for early refills.  I'll continue his current psychotropic medication.  Recommended to call us back if he has any questions or any concern.  I have given a 90 day prescription of his medication and copy of his blood results.  Recommended to call us back if he has any question on any concern.  I will see him in 3 months.  Kasumi Ditullio T., MD 09/11/2013

## 2013-12-14 ENCOUNTER — Ambulatory Visit (INDEPENDENT_AMBULATORY_CARE_PROVIDER_SITE_OTHER): Payer: BC Managed Care – PPO | Admitting: Psychiatry

## 2013-12-14 ENCOUNTER — Encounter (HOSPITAL_COMMUNITY): Payer: Self-pay | Admitting: Psychiatry

## 2013-12-14 VITALS — BP 120/80 | HR 66 | Ht 74.0 in | Wt 170.6 lb

## 2013-12-14 DIAGNOSIS — F988 Other specified behavioral and emotional disorders with onset usually occurring in childhood and adolescence: Secondary | ICD-10-CM

## 2013-12-14 MED ORDER — LISDEXAMFETAMINE DIMESYLATE 40 MG PO CAPS: 40.0000 mg | ORAL_CAPSULE | ORAL | Status: DC

## 2013-12-14 NOTE — Progress Notes (Signed)
[facility] [id number] Progress Note  Gabriel Klein [id number] 28 y.o.  12/14/2013 10:33 AM  Chief Complaint: Medication management and followup.  History of Present Illness: Gabriel Klein came for his followup appointment.  He is compliant with this Vyvanse.  He is enjoying his summer time.  He took some time off and went to [place] with his friends.  He had a good time there.  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  He is enrolled in [employer].  He is able to do multitasking.  He feels proud because he has not been drinking.  He denies irritability, anger, mood swing.  His sleep is improved.  Denies any tremors or shakes.  He does not ask for early refills.  He is not using any illegal drugs. Patient lives in [place], he lives by himself.  He is not married and he has no children.  Suicidal Ideation: No Plan Formed: No Patient has means to carry out plan: No  Homicidal Ideation: No Plan Formed: No Patient has means to carry out plan: No  Review of Systems: Psychiatric: Agitation: No Hallucination: No Depressed Mood: No Insomnia: No Hypersomnia: No Altered Concentration: No Feels Worthless: No Grandiose Ideas: No Belief In Special Powers: No New/Increased Substance Abuse: No Compulsions: No  Neurologic: Headache: No Seizure: No Paresthesias: No  Medical History:  Patient has no active medical problems.  He has no primary care physician.    Outpatient Encounter Prescriptions as of 12/14/2013  Medication Sig  . lisdexamfetamine (VYVANSE) 40 MG capsule Take 1 capsule (40 mg total) by mouth every morning.  . [DISCONTINUED] lisdexamfetamine (VYVANSE) 40 MG capsule Take 1 capsule (40 mg total) by mouth every morning.    Past Psychiatric History/Hospitalization(s): Anxiety: No Bipolar Disorder: No Depression: No Mania: No Psychosis: No Schizophrenia:  No Personality Disorder: No Hospitalization for psychiatric illness: No History of Electroconvulsive Shock Therapy: No Prior Suicide Attempts: No  Physical Exam: Constitutional:  BP 120/80  Pulse 66  Ht 6\' 2"  (1.88 m)  Wt 170 lb 9.6 oz (77.384 kg)  BMI 21.89 kg/m2  No results found for this or any previous visit (from the past 2160 hour(s)).  General Appearance: well nourished  Musculoskeletal: Strength & Muscle Tone: within normal limits Gait & Station: normal Patient leans: N/A  Psychiatric: Speech (describe rate, volume, coherence, spontaneity, and abnormalities if any): Clear and coherent.  Fast  but normal tone and volume.  Thought Process (describe rate, content, abstract reasoning, and computation): Logical and goal directed.  Associations: Coherent and Intact  Thoughts: normal  Mental Status: Orientation: oriented to person, place, time/date, situation, day of week, month of year and year Mood & Affect: normal affect Attention Span & Concentration: Fair  Established Problem, Stable/Improving (1), Review of Last Therapy Session (1) and Review of Medication Regimen & Side Effects (2)  Assessment: Axis I: ADD  Axis II: Deferred  Axis III: No active medical problems  Axis IV: Mild  Axis V: 75-80   Plan:  Patient is doing much better on his Vyvanse 40 mg.  He does not ask for early refills.  I'll continue his current psychotropic medication.  Recommended to call us back if he has any questions or any concern.  I have given a 90 day supply. Recommended to call us back if he has any question on any concern.  I will see  him in 3 months.  Senovia Gauer T., MD 12/14/2013

## 2014-03-15 ENCOUNTER — Encounter (HOSPITAL_COMMUNITY): Payer: Self-pay | Admitting: Psychiatry

## 2014-03-15 ENCOUNTER — Ambulatory Visit (INDEPENDENT_AMBULATORY_CARE_PROVIDER_SITE_OTHER): Payer: BC Managed Care – PPO | Admitting: Psychiatry

## 2014-03-15 VITALS — BP 112/73 | HR 58 | Ht 74.0 in | Wt 172.8 lb

## 2014-03-15 DIAGNOSIS — F909 Attention-deficit hyperactivity disorder, unspecified type: Secondary | ICD-10-CM

## 2014-03-15 DIAGNOSIS — F988 Other specified behavioral and emotional disorders with onset usually occurring in childhood and adolescence: Secondary | ICD-10-CM

## 2014-03-15 MED ORDER — LISDEXAMFETAMINE DIMESYLATE 40 MG PO CAPS
40.0000 mg | ORAL_CAPSULE | ORAL | Status: DC
Start: 2014-03-15 — End: 2014-06-15

## 2014-03-15 NOTE — Progress Notes (Signed)
  [facility] [id number] Progress Note  Anne FuLarry Klein [id number] 28 y.o.  03/15/2014 12:05 PM  Chief Complaint: Medication management and followup.  History of Present Illness: Gabriel NajjarLarry came for his followup appointment.  He is taking Vyvanse and denies any side effects.  He is excited about the upcoming graduation on December 10.  However he is worried about his boards.  Overall his attention is good.  He is able to do multitasking.  Denies any insomnia, irritability, anger or any mood swings.   He is enrolled in [employer].  He is not drinking or using any illegal substances.  His appetite is okay.  His vitals are stable.  [place] in [name], he lives by himself.  He is not married and he has no children.  Suicidal Ideation: No Plan Formed: No Patient has means to carry out plan: No  Homicidal Ideation: No Plan Formed: No Patient has means to carry out plan: No  Review of Systems: Psychiatric: Agitation: No Hallucination: No Depressed Mood: No Insomnia: No Hypersomnia: No Altered Concentration: No Feels Worthless: No Grandiose Ideas: No Belief In Special Powers: No New/Increased Substance Abuse: No Compulsions: No  Neurologic: Headache: No Seizure: No Paresthesias: No  Medical History:  Patient has no active medical problems.  He has no primary care physician.    Outpatient Encounter Prescriptions as of 03/15/2014  Medication Sig  . lisdexamfetamine (VYVANSE) 40 MG capsule Take 1 capsule (40 mg total) by mouth every morning.  . [DISCONTINUED] lisdexamfetamine (VYVANSE) 40 MG capsule Take 1 capsule (40 mg total) by mouth every morning.    Past Psychiatric History/Hospitalization(s): Anxiety: No Bipolar Disorder: No Depression: No Mania: No Psychosis: No Schizophrenia: No Personality Disorder: No Hospitalization for psychiatric illness: No History of Electroconvulsive Shock Therapy: No Prior Suicide Attempts: No  Physical  Exam: Constitutional:  BP 112/73  Pulse 58  Ht 6\' 2"  (1.88 m)  Wt 172 lb 12.8 oz (78.382 kg)  BMI 22.18 kg/m2  No results found for this or any previous visit (from the past 2160 hour(s)).  General Appearance: well nourished  Musculoskeletal: Strength & Muscle Tone: within normal limits Gait & Station: normal Patient leans: N/A  Psychiatric: Speech (describe rate, volume, coherence, spontaneity, and abnormalities if any): Clear and coherent.  Fast  but normal tone and volume.  Thought Process (describe rate, content, abstract reasoning, and computation): Logical and goal directed.  Associations: Coherent and Intact  Thoughts: normal  Mental Status: Orientation: oriented to person, place, time/date, situation, day of week, month of year and year Mood & Affect: normal affect Attention Span & Concentration: Fair  Established Problem, Stable/Improving (1), Review of Last Therapy Session (1) and Review of Medication Regimen & Side Effects (2)  Assessment: Axis I: ADD  Axis II: Deferred  Axis III: No active medical problems  Axis IV: Mild  Axis V: 75-80   Plan:  Patient is doing much better on his Vyvanse 40 mg.  He does not ask for early refills.  I'll continue his current psychotropic medication.  Recommended to call us back if he has any questions or any concern.  I have given a 90 day supply. Recommended to call us back if he has any question on any concern.  I will see him in 3 months.  Jeffrie Stander T., MD 03/15/2014

## 2014-03-16 ENCOUNTER — Ambulatory Visit (HOSPITAL_COMMUNITY): Payer: Self-pay | Admitting: Psychiatry

## 2014-06-15 ENCOUNTER — Encounter (HOSPITAL_COMMUNITY): Payer: Self-pay | Admitting: Psychiatry

## 2014-06-15 ENCOUNTER — Ambulatory Visit (INDEPENDENT_AMBULATORY_CARE_PROVIDER_SITE_OTHER): Payer: BLUE CROSS/BLUE SHIELD | Admitting: Psychiatry

## 2014-06-15 VITALS — BP 129/86 | HR 61 | Ht 74.0 in | Wt 173.4 lb

## 2014-06-15 DIAGNOSIS — F988 Other specified behavioral and emotional disorders with onset usually occurring in childhood and adolescence: Secondary | ICD-10-CM

## 2014-06-15 DIAGNOSIS — F909 Attention-deficit hyperactivity disorder, unspecified type: Secondary | ICD-10-CM

## 2014-06-15 MED ORDER — LISDEXAMFETAMINE DIMESYLATE 40 MG PO CAPS: 40.0000 mg | ORAL_CAPSULE | ORAL | Status: DC

## 2014-06-15 NOTE — Progress Notes (Signed)
  [facility] [id number] Progress Note  Anne FuLarry Klein [id number] 29 y.o.  06/15/2014 10:27 AM  Chief Complaint: Medication management and followup.  History of Present Illness: Gabriel NajjarLarry came for his followup appointment.  He is very excited .  He graduated last month and he is applying to take the test.  He has a job offer from [employer] and he may start very soon.  He is also in a process of moving from [place].  He is taking Vyvanse and denies any side effects.  He sleeping good.  He denies any irritability, anger, chest pain or any mood swings.  His appetite is okay.  His vitals are stable.  He is able to do multitasking and he's appendectomy that his attention and concentration is good.  Patient denies drinking or using any illegal substances.  He is not married and he has no children.    Suicidal Ideation: No Plan Formed: No Patient has means to carry out plan: No  Homicidal Ideation: No Plan Formed: No Patient has means to carry out plan: No  Review of Systems: Psychiatric: Agitation: No Hallucination: No Depressed Mood: No Insomnia: No Hypersomnia: No Altered Concentration: No Feels Worthless: No Grandiose Ideas: No Belief In Special Powers: No New/Increased Substance Abuse: No Compulsions: No  Neurologic: Headache: No Seizure: No Paresthesias: No  Medical History:  Patient has no active medical problems.  He has no primary care physician.    Outpatient Encounter Prescriptions as of 06/15/2014  Medication Sig  . lisdexamfetamine (VYVANSE) 40 MG capsule Take 1 capsule (40 mg total) by mouth every morning.  . [DISCONTINUED] lisdexamfetamine (VYVANSE) 40 MG capsule Take 1 capsule (40 mg total) by mouth every morning.    Past Psychiatric History/Hospitalization(s): Anxiety: No Bipolar Disorder: No Depression: No Mania: No Psychosis: No Schizophrenia: No Personality Disorder: No Hospitalization for psychiatric illness:  No History of Electroconvulsive Shock Therapy: No Prior Suicide Attempts: No  Physical Exam: Constitutional:  BP 129/86 mmHg  Pulse 61  Ht 6\' 2"  (1.88 m)  Wt 173 lb 6.4 oz (78.654 kg)  BMI 22.25 kg/m2  No results found for this or any previous visit (from the past 2160 hour(s)).  General Appearance: well nourished  Musculoskeletal: Strength & Muscle Tone: within normal limits Gait & Station: normal Patient leans: N/A  Psychiatric: Speech (describe rate, volume, coherence, spontaneity, and abnormalities if any): Clear and coherent.  Fast  but normal tone and volume.  Thought Process (describe rate, content, abstract reasoning, and computation): Logical and goal directed.  Associations: Coherent and Intact  Thoughts: normal  Mental Status: Orientation: oriented to person, place, time/date, situation, day of week, month of year and year Mood & Affect: normal affect Attention Span & Concentration: Fair  Established Problem, Stable/Improving (1), Review of Last Therapy Session (1) and Review of Medication Regimen & Side Effects (2)  Assessment: Axis I: ADD  Axis II: Deferred  Axis III: No active medical problems  Plan:  Patient is doing much better on his Vyvanse 40 mg.  he is happy that he graduated.  He has no side effects of Vyvanse.  He does not ask for early refills.  I will continue Vyvanse 40 mg daily.  90 day supply prescription is given.  Recommended to call us back if he has any questions or any concern.  I will see him in 3 months.  Chrisann Melaragno T., MD 06/15/2014

## 2014-09-14 ENCOUNTER — Encounter (HOSPITAL_COMMUNITY): Payer: Self-pay | Admitting: Psychiatry

## 2014-09-14 ENCOUNTER — Ambulatory Visit (HOSPITAL_COMMUNITY): Payer: Self-pay | Admitting: Psychiatry

## 2014-09-14 ENCOUNTER — Ambulatory Visit (INDEPENDENT_AMBULATORY_CARE_PROVIDER_SITE_OTHER): Payer: PRIVATE HEALTH INSURANCE | Admitting: Psychiatry

## 2014-09-14 VITALS — BP 142/98 | HR 68 | Ht 73.75 in | Wt 175.6 lb

## 2014-09-14 DIAGNOSIS — Z79899 Other long term (current) drug therapy: Secondary | ICD-10-CM

## 2014-09-14 DIAGNOSIS — F909 Attention-deficit hyperactivity disorder, unspecified type: Secondary | ICD-10-CM

## 2014-09-14 DIAGNOSIS — F988 Other specified behavioral and emotional disorders with onset usually occurring in childhood and adolescence: Secondary | ICD-10-CM

## 2014-09-14 MED ORDER — LISDEXAMFETAMINE DIMESYLATE 40 MG PO CAPS: 40.0000 mg | ORAL_CAPSULE | ORAL | Status: DC

## 2014-09-14 NOTE — Progress Notes (Signed)
[facility] [address] Progress Note  [name] [id number] 29 y.o.  09/14/2014 10:49 AM  Chief Complaint: Medication management and followup.  History of Present Illness: Gabriel Klein came for his followup appointment.  He is taking his medication as prescribed.  He has a good energy level and he feels his attention and focus is much better.  He is able to do multitasking.  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  He likes his job.  He has no issues.  He sleeping good.  He denies any irritability, anger, mood swings.  He is taking Vyvanse and he has no tremors or shakes.  His appetite is okay.  His blood pressure was slightly high but he believe that he was running late for the appointment and he was anxious.  Patient denies drinking or using any illegal substances.  Patient is single and he has no children.    Suicidal Ideation: No Plan Formed: No Patient has means to carry out plan: No  Homicidal Ideation: No Plan Formed: No Patient has means to carry out plan: No  Review of Systems: Psychiatric: Agitation: No Hallucination: No Depressed Mood: No Insomnia: No Hypersomnia: No Altered Concentration: No Feels Worthless: No Grandiose Ideas: No Belief In Special Powers: No New/Increased Substance Abuse: No Compulsions: No  Neurologic: Headache: No Seizure: No Paresthesias: No  Medical History:  Patient has no active medical problems.  He has no primary care physician.    Outpatient Encounter Prescriptions as of 09/14/2014  Medication Sig  . lisdexamfetamine (VYVANSE) 40 MG capsule Take 1 capsule (40 mg total) by mouth every morning.  . [DISCONTINUED] lisdexamfetamine (VYVANSE) 40 MG capsule Take 1 capsule (40 mg total) by mouth every morning.    Past Psychiatric History/Hospitalization(s): Anxiety: No Bipolar Disorder: No Depression: No Mania: No Psychosis: No Schizophrenia: No Personality Disorder: No Hospitalization for psychiatric illness:  No History of Electroconvulsive Shock Therapy: No Prior Suicide Attempts: No  Physical Exam: Constitutional:  BP 142/98 mmHg  Pulse 68  Ht 6' 1.75" (1.873 m)  Wt 175 lb 9.6 oz (79.652 kg)  BMI 22.70 kg/m2  No results found for this or any previous visit (from the past 2160 hour(s)).  General Appearance: well nourished  Musculoskeletal: Strength & Muscle Tone: within normal limits Gait & Station: normal Patient leans: N/A  Psychiatric: Speech (describe rate, volume, coherence, spontaneity, and abnormalities if any): Clear and coherent.  Fast  but normal tone and volume.  Thought Process (describe rate, content, abstract reasoning, and computation): Logical and goal directed.  Associations: Coherent and Intact  Thoughts: normal  Mental Status: Orientation: oriented to person, place, time/date, situation, day of week, month of year and year Mood & Affect: anxiety Attention Span & Concentration: Fair  Established Problem, Stable/Improving (1), Review of Psycho-Social Stressors (1), Review or order clinical lab tests (1), Review of Last Therapy Session (1) and Review of Medication Regimen & Side Effects (2)  Assessment: Axis I: ADD  Axis II: Deferred  Axis III: No active medical problems  Plan:  Patient is doing much better on his Vyvanse 40 mg.  discussed medication side effects and address mild elevation of blood pressure.  If it continued to persist then consider seeing primary care physician.  We will do blood work including CBC, CMP, hemoglobin A1c and TSH.  I will continue Vyvanse 40 mg daily.  90 day supply prescription is given.  Recommended to call us back if he has any questions or any concern.  I will see him in 3 months.  Albena Comes T., MD 09/14/2014

## 2014-11-18 ENCOUNTER — Telehealth (HOSPITAL_COMMUNITY): Payer: Self-pay | Admitting: *Deleted

## 2014-11-18 NOTE — Telephone Encounter (Signed)
Prior authorization for Vyvanse received from [facility]. Called [phone] spoke with Dewayne Hatch who states it will be sent for review.

## 2014-11-19 ENCOUNTER — Telehealth (HOSPITAL_COMMUNITY): Payer: Self-pay

## 2014-11-19 NOTE — Telephone Encounter (Signed)
Telephone call with patient after he left a message questioning if we had received a prior authorization request from [facility]?  Informed Gabriel Klein the request had been received and had already be sent to his insurance company for review.  Informed patient we would let him know once we receive back a decision.

## 2014-11-23 NOTE — Telephone Encounter (Signed)
Telephone call with patient to inform his prior authorization for Vyvanse 40mg  was approved by [facility].  Informed PA approval dates would be from 11/18/14-11/18/15, Case Number: [id number].  [facility] on [address] to inform.  [facility]ed [facility] and informed medication approved.  Pharmacist was able to get 30 day supply to go through but not 90 day so patient will have to have orders for each month instead of 90 day orders.

## 2014-12-08 LAB — CBC WITH DIFFERENTIAL/PLATELET
BASOS ABS: 0.1 10*3/uL (ref 0.0–0.1)
Basophils Relative: 1 % (ref 0–1)
EOS PCT: 3 % (ref 0–5)
Eosinophils Absolute: 0.2 10*3/uL (ref 0.0–0.7)
HEMATOCRIT: 48.7 % (ref 39.0–52.0)
HEMOGLOBIN: 16.7 g/dL (ref 13.0–17.0)
Lymphocytes Relative: 29 % (ref 12–46)
Lymphs Abs: 2.1 10*3/uL (ref 0.7–4.0)
MCH: 31.7 pg (ref 26.0–34.0)
MCHC: 34.3 g/dL (ref 30.0–36.0)
MCV: 92.6 fL (ref 78.0–100.0)
MONO ABS: 0.5 10*3/uL (ref 0.1–1.0)
MONOS PCT: 7 % (ref 3–12)
MPV: 11.2 fL (ref 8.6–12.4)
NEUTROS ABS: 4.4 10*3/uL (ref 1.7–7.7)
NEUTROS PCT: 60 % (ref 43–77)
Platelets: 205 10*3/uL (ref 150–400)
RBC: 5.26 MIL/uL (ref 4.22–5.81)
RDW: 13.9 % (ref 11.5–15.5)
WBC: 7.4 10*3/uL (ref 4.0–10.5)

## 2014-12-08 LAB — COMPLETE METABOLIC PANEL WITH GFR
ALK PHOS: 45 U/L (ref 39–117)
ALT: 33 U/L (ref 0–53)
AST: 28 U/L (ref 0–37)
Albumin: 4.7 g/dL (ref 3.5–5.2)
BUN: 12 mg/dL (ref 6–23)
CALCIUM: 9.8 mg/dL (ref 8.4–10.5)
CO2: 23 meq/L (ref 19–32)
Chloride: 101 mEq/L (ref 96–112)
Creat: 0.81 mg/dL (ref 0.50–1.35)
GFR, Est Non African American: >89 mL/min
Glucose, Bld: 94 mg/dL (ref 70–99)
POTASSIUM: 4.2 meq/L (ref 3.5–5.3)
SODIUM: 138 meq/L (ref 135–145)
Total Bilirubin: 0.5 mg/dL (ref 0.2–1.2)
Total Protein: 7.7 g/dL (ref 6.0–8.3)

## 2014-12-08 LAB — HEMOGLOBIN A1C
Hgb A1c MFr Bld: 5.4 % (ref <5.7)
Mean Plasma Glucose: 108 mg/dL (ref <117)

## 2014-12-08 LAB — TSH: TSH: 1.25 u[IU]/mL (ref 0.350–4.500)

## 2014-12-14 ENCOUNTER — Ambulatory Visit (INDEPENDENT_AMBULATORY_CARE_PROVIDER_SITE_OTHER): Payer: PRIVATE HEALTH INSURANCE | Admitting: Psychiatry

## 2014-12-14 ENCOUNTER — Encounter (HOSPITAL_COMMUNITY): Payer: Self-pay | Admitting: Psychiatry

## 2014-12-14 VITALS — BP 134/77 | HR 68 | Ht 74.0 in | Wt 177.4 lb

## 2014-12-14 DIAGNOSIS — F909 Attention-deficit hyperactivity disorder, unspecified type: Secondary | ICD-10-CM

## 2014-12-14 DIAGNOSIS — F988 Other specified behavioral and emotional disorders with onset usually occurring in childhood and adolescence: Secondary | ICD-10-CM

## 2014-12-14 MED ORDER — VYVANSE 40 MG PO CAPS: 40.0000 mg | ORAL_CAPSULE | Freq: Every morning | ORAL | Status: DC

## 2014-12-14 NOTE — Progress Notes (Signed)
[place] [phone] Progress Note  Gabriel Klein [id number] 29 y.o.  12/14/2014 1:30 PM  Chief Complaint: Medication management and followup.  History of Present Illness: Link came for his followup appointment.  He is compliant with his medication and reported no side effects. Today his blood pressure is stable. 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  His energy level is good and he is more focus and attentive. He is able to do multi taking and he denies any irritability, anger, mood swings.  He likes Vyvanse and he has no tremors or shakes.  His appetite and sleep is okay.  He has blood work and CBC, TSH and chemistry is normal. His HbA1C is 5.5. Patient denies drinking or using any illegal substances.  Patient is single and he has no children.    Suicidal Ideation: No Plan Formed: No Patient has means to carry out plan: No  Homicidal Ideation: No Plan Formed: No Patient has means to carry out plan: No  Review of Systems: Psychiatric: Agitation: No Hallucination: No Depressed Mood: No Insomnia: No Hypersomnia: No Altered Concentration: No Feels Worthless: No Grandiose Ideas: No Belief In Special Powers: No New/Increased Substance Abuse: No Compulsions: No  Neurologic: Headache: No Seizure: No Paresthesias: No  Medical History:  Patient has no active medical problems.  He has no primary care physician.    Outpatient Encounter Prescriptions as of 12/14/2014  Medication Sig  . VYVANSE 40 MG capsule Take 1 capsule (40 mg total) by mouth every morning.  . [DISCONTINUED] VYVANSE 40 MG capsule Take 40 mg by mouth every morning.  . [DISCONTINUED] VYVANSE 40 MG capsule Take 1 capsule (40 mg total) by mouth every morning.  . [DISCONTINUED] lisdexamfetamine (VYVANSE) 40 MG capsule Take 1 capsule (40 mg total) by mouth every morning.   No facility-administered encounter medications on file as of 12/14/2014.    Past Psychiatric  History/Hospitalization(s): Anxiety: No Bipolar Disorder: No Depression: No Mania: No Psychosis: No Schizophrenia: No Personality Disorder: No Hospitalization for psychiatric illness: No History of Electroconvulsive Shock Therapy: No Prior Suicide Attempts: No  Physical Exam: Constitutional:  BP 134/77 mmHg  Pulse 68  Ht 6' 2" (1.88 m)  Wt 177 lb 6.4 oz (80.468 kg)  BMI 22.77 kg/m2  Recent Results (from the past 2160 hour(s))  TSH     Status: None   Collection Time: 12/08/14 12:00 AM  Result Value Ref Range   TSH 1.250 0.350 - 4.500 uIU/mL  CBC with Differential/Platelet     Status: None   Collection Time: 12/08/14 12:00 AM  Result Value Ref Range   WBC 7.4 4.0 - 10.5 K/uL   RBC 5.26 4.22 - 5.81 MIL/uL   Hemoglobin 16.7 13.0 - 17.0 g/dL   HCT 48.7 39.0 - 52.0 %   MCV 92.6 78.0 - 100.0 fL   MCH 31.7 26.0 - 34.0 pg   MCHC 34.3 30.0 - 36.0 g/dL   RDW 13.9 11.5 - 15.5 %   Platelets 205 150 - 400 K/uL   MPV 11.2 8.6 - 12.4 fL   Neutrophils Relative % 60 43 - 77 %   Neutro Abs 4.4 1.7 - 7.7 K/uL   Lymphocytes Relative 29 12 - 46 %   Lymphs Abs 2.1 0.7 - 4.0 K/uL   Monocytes Relative 7 3 - 12 %   Monocytes Absolute 0.5 0.1 - 1.0 K/uL   Eosinophils Relative 3 0 - 5 %   Eosinophils  Absolute 0.2 0.0 - 0.7 K/uL   Basophils Relative 1 0 - 1 %   Basophils Absolute 0.1 0.0 - 0.1 K/uL   Smear Review Criteria for review not met   COMPLETE METABOLIC PANEL WITH GFR     Status: None   Collection Time: 12/08/14 12:00 AM  Result Value Ref Range   Sodium 138 135 - 145 mEq/L   Potassium 4.2 3.5 - 5.3 mEq/L   Chloride 101 96 - 112 mEq/L   CO2 23 19 - 32 mEq/L   Glucose, Bld 94 70 - 99 mg/dL   BUN 12 6 - 23 mg/dL   Creat 0.81 0.50 - 1.35 mg/dL   Total Bilirubin 0.5 0.2 - 1.2 mg/dL   Alkaline Phosphatase 45 39 - 117 U/L   AST 28 0 - 37 U/L   ALT 33 0 - 53 U/L   Total Protein 7.7 6.0 - 8.3 g/dL   Albumin 4.7 3.5 - 5.2 g/dL   Calcium 9.8 8.4 - 10.5 mg/dL   GFR, Est African  American >89 mL/min   GFR, Est Non African American >89 mL/min    Comment:   The estimated GFR is a calculation valid for adults (>=40 years old) that uses the CKD-EPI algorithm to adjust for age and sex. It is   not to be used for children, pregnant women, hospitalized patients,    patients on dialysis, or with rapidly changing kidney function. According to the NKDEP, eGFR >89 is normal, 60-89 shows mild impairment, 30-59 shows moderate impairment, 15-29 shows severe impairment and <15 is ESRD.     Hemoglobin A1c     Status: None   Collection Time: 12/08/14 12:00 AM  Result Value Ref Range   Hgb A1c MFr Bld 5.4 <5.7 %    Comment:                                                                        According to the ADA Clinical Practice Recommendations for 2011, when HbA1c is used as a screening test:     >=6.5%   Diagnostic of Diabetes Mellitus            (if abnormal result is confirmed)   5.7-6.4%   Increased risk of developing Diabetes Mellitus   References:Diagnosis and Classification of Diabetes Mellitus,Diabetes FXJO,8325,49(IYMEB 1):S62-S69 and Standards of Medical Care in         Diabetes - 2011,Diabetes Care,2011,34 (Suppl 1):S11-S61.      Mean Plasma Glucose 108 <117 mg/dL    General Appearance: well nourished  Musculoskeletal: Strength & Muscle Tone: within normal limits Gait & Station: normal Patient leans: N/A  Psychiatric: Speech (describe rate, volume, coherence, spontaneity, and abnormalities if any): Clear and coherent.  Fast  but normal tone and volume.  Thought Process (describe rate, content, abstract reasoning, and computation): Logical and goal directed.  Associations: Coherent and Intact  Thoughts: normal  Mental Status: Orientation: oriented to person, place, time/date, situation, day of week, month of year and year Mood & Affect: anxiety Attention Span & Concentration: Fair  Established Problem, Stable/Improving (1), Review of  Psycho-Social Stressors (1), Review or order clinical lab tests (1), Review of Last Therapy Session (1) and Review of Medication Regimen &  Side Effects (2)  Assessment: Axis I: ADD  Axis II: Deferred  Axis III: No active medical problems  Plan:  Patient is stable on Vyvanse 40 mg.  Discussed medication side effects and reviewed lab results. I will continue Vyvanse 40 mg daily.  His insurance does not approved 90 days supply prescription, he will require 30 days supply which is given.  Recommended to call us back if he has any questions or any concern.  I will see him in 3 months.  Kasi Lasky T., MD 12/14/2014

## 2015-02-21 ENCOUNTER — Telehealth (HOSPITAL_COMMUNITY): Payer: Self-pay

## 2015-02-21 DIAGNOSIS — F988 Other specified behavioral and emotional disorders with onset usually occurring in childhood and adolescence: Secondary | ICD-10-CM

## 2015-02-22 MED ORDER — VYVANSE 40 MG PO CAPS: 40.0000 mg | ORAL_CAPSULE | Freq: Every morning | ORAL | Status: DC

## 2015-02-22 NOTE — Telephone Encounter (Signed)
Met with Dr. Arfeen who authorized a new Vyvanse order for patient.  Order printed, reviewed and signed by Dr. Arfeen.   Telephone call with patient to inform new Vyvanse order prepared for pick up and reminded patient of next appointment on 03/17/15. 

## 2015-02-22 NOTE — Telephone Encounter (Signed)
Medication refill request - Telephone message received from patient requesting a refill of Vyvanse.  Patient returns on 03/17/15 and last Vyvanse order was to be filled on 01/23/15.

## 2015-02-28 ENCOUNTER — Telehealth (HOSPITAL_COMMUNITY): Payer: Self-pay

## 2015-02-28 NOTE — Telephone Encounter (Signed)
[name] up prescription on 16/1/09  Lic [id number]  dlo

## 2015-03-17 ENCOUNTER — Encounter (HOSPITAL_COMMUNITY): Payer: Self-pay | Admitting: Psychiatry

## 2015-03-17 ENCOUNTER — Ambulatory Visit (INDEPENDENT_AMBULATORY_CARE_PROVIDER_SITE_OTHER): Payer: PRIVATE HEALTH INSURANCE | Admitting: Psychiatry

## 2015-03-17 VITALS — BP 130/87 | HR 76 | Ht 72.75 in | Wt 171.0 lb

## 2015-03-17 DIAGNOSIS — F909 Attention-deficit hyperactivity disorder, unspecified type: Secondary | ICD-10-CM

## 2015-03-17 DIAGNOSIS — F988 Other specified behavioral and emotional disorders with onset usually occurring in childhood and adolescence: Secondary | ICD-10-CM

## 2015-03-17 MED ORDER — VYVANSE 40 MG PO CAPS: 40.0000 mg | ORAL_CAPSULE | Freq: Every morning | ORAL | Status: DC

## 2015-03-17 NOTE — Progress Notes (Signed)
[facility] [address] Progress Note  Gabriel Klein [id number] 29 y.o.  03/17/2015 8:44 AM  Chief Complaint: Medication management and followup.  History of Present Illness: Gabriel Klein came for his followup appointment.  He appears tired as he coming from night shift .  He admitted last night was busy and he did not had a chance to sleep.  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  He is very happy with his job.  He denies any irritability, anger, mood swing.  His attention and focus is good.  He is happy that he is able to do multitasking without any problem.  He takes Vyvanse Monday to Friday and he usually skips on the weekends.  He has no tremors, shakes.  Today his blood pressure is slightly increased because he was rushing to the appointment .  He is using bike for short traveling and he likes it.  He denies any shortness of breath, chest pain.  He is more health-conscious and started walking, biking and watching his calorie intake's.  He had lost 7 pounds since the last visit.  His energy level is good.  He sleeps better .  He is excited because in first week of November he is going to [place] to attend 3 day conference.  Patient denies any paranoia or any hallucination.  He is living with roommate .  His parent lives in [place] and he see them very frequently.  Patient has no children.  Currently he is not in any serious relationship.  He denies drinking or using any illegal substances.  His appetite is okay.  Suicidal Ideation: No Plan Formed: No Patient has means to carry out plan: No  Homicidal Ideation: No Plan Formed: No Patient has means to carry out plan: No  Review of Systems: Psychiatric: Agitation: No Hallucination: No Depressed Mood: No Insomnia: No Hypersomnia: No Altered Concentration: No Feels Worthless: No Grandiose Ideas: No Belief In Special Powers: No New/Increased Substance Abuse: No Compulsions: No  Neurologic: Headache: No Seizure:  No Paresthesias: No  Medical History:  Patient has no active medical problems.  He has no primary care physician.    Outpatient Encounter Prescriptions as of 03/17/2015  Medication Sig  . VYVANSE 40 MG capsule Take 1 capsule (40 mg total) by mouth every morning.  . [DISCONTINUED] VYVANSE 40 MG capsule Take 1 capsule (40 mg total) by mouth every morning.  . [DISCONTINUED] VYVANSE 40 MG capsule Take 1 capsule (40 mg total) by mouth every morning.   No facility-administered encounter medications on file as of 03/17/2015.    Past Psychiatric History/Hospitalization(s): Anxiety: No Bipolar Disorder: No Depression: No Mania: No Psychosis: No Schizophrenia: No Personality Disorder: No Hospitalization for psychiatric illness: No History of Electroconvulsive Shock Therapy: No Prior Suicide Attempts: No  Physical Exam: Constitutional:  BP 130/87 mmHg  Pulse 76  Ht 6' 0.75" (1.848 m)  Wt 171 lb (77.565 kg)  BMI 22.71 kg/m2  No results found for this or any previous visit (from the past 2160 hour(s)).  General Appearance: well nourished  Musculoskeletal: Strength & Muscle Tone: within normal limits Gait & Station: normal Patient leans: N/A  Psychiatric: Psychiatric Specialty Exam: Physical Exam  Review of Systems  Constitutional: Positive for weight loss. Negative for malaise/fatigue.  HENT: Negative.   Cardiovascular: Negative for chest pain and palpitations.  Musculoskeletal: Negative.   Neurological: Negative for dizziness, tingling and tremors.  Psychiatric/Behavioral: Negative for depression, suicidal ideas, hallucinations and substance abuse. The patient does not  have insomnia.     Blood pressure 130/87, pulse 76, height 6' 0.75" (1.848 m), weight 171 lb (77.565 kg).Body mass index is 22.71 kg/(m^2).  General Appearance: Casual and Tired  Eye Contact::  Good  Speech:  Slow  Volume:  Normal  Mood:  Euthymic  Affect:  Appropriate  Thought Process:  Coherent   Orientation:  Full (Time, Place, and Person)  Thought Content:  WDL  Suicidal Thoughts:  No  Homicidal Thoughts:  No  Memory:  Immediate;   Good Recent;   Good Remote;   Good  Judgement:  Good  Insight:  Good  Psychomotor Activity:  Normal  Concentration:  Good  Recall:  Good  Fund of Knowledge:  Good  Language:  Good  Akathisia:  No  Handed:  Right  AIMS (if indicated):     Assets:  Communication Skills Desire for Improvement Financial Resources/Insurance Housing Leisure Time Physical Health Social Support Vocational/Educational  ADL's:  Intact  Cognition:  WNL  Sleep:       Established Problem, Stable/Improving (1), Review of Last Therapy Session (1) and Review of Medication Regimen & Side Effects (2)  Assessment: Axis I: ADD  Axis II: Deferred  Axis III: No active medical problems  Plan:  Patient is stable on Vyvanse 40 mg.  He has no side effects.  Discussed medication side effects and benefits.  Discussed medication side effects and reviewed lab results.  Continue Vyvanse 40 mg daily.  Recommended to call us back if he has any questions or any concern.  I will see him in 3 months.  Asier Desroches T., MD 03/17/2015

## 2015-06-20 ENCOUNTER — Ambulatory Visit (HOSPITAL_COMMUNITY): Payer: Self-pay | Admitting: Psychiatry

## 2015-06-24 ENCOUNTER — Encounter (HOSPITAL_COMMUNITY): Payer: Self-pay | Admitting: Psychiatry

## 2015-06-24 ENCOUNTER — Ambulatory Visit (INDEPENDENT_AMBULATORY_CARE_PROVIDER_SITE_OTHER): Payer: PRIVATE HEALTH INSURANCE | Admitting: Psychiatry

## 2015-06-24 VITALS — BP 132/80 | HR 92 | Ht 73.75 in | Wt 170.8 lb

## 2015-06-24 DIAGNOSIS — F909 Attention-deficit hyperactivity disorder, unspecified type: Secondary | ICD-10-CM | POA: Diagnosis not present

## 2015-06-24 DIAGNOSIS — F988 Other specified behavioral and emotional disorders with onset usually occurring in childhood and adolescence: Secondary | ICD-10-CM

## 2015-06-24 MED ORDER — VYVANSE 40 MG PO CAPS: 40.0000 mg | ORAL_CAPSULE | Freq: Every morning | ORAL | Status: DC

## 2015-06-24 NOTE — Progress Notes (Signed)
[facility] [address] Progress Note  Gabriel Klein [id number] 30 y.o.  06/24/2015 9:42 AM  Chief Complaint: Medication management and followup.  History of Present Illness: Gabriel Klein came for his followup appointment.  He had a good Christmas.  He visited his parents who lives in [place].  He endorses relationship is going well.  He likes his job.  He is working at [employer] .  His attention and focus is good.  He is able to do multitasking.  He is taking Vyvanse 5 days a week and he does not take on the weekends.  Recently he had a stomach flu and he was sick but now he is feeling better.  Overall he described his mood stable.  He denies any irritability, anger, mood swing.  His energy level is good.  His social active and denies any other concern.  He lives with his roommate but lately he heard that his roommate has some legal issues but he do not know the details.  He is worried about him.  His relationship is going very well.  He denies drinking or using any illegal substances.  His appetite is okay.  His vitals are stable.  Suicidal Ideation: No Plan Formed: No Patient has means to carry out plan: No  Homicidal Ideation: No Plan Formed: No Patient has means to carry out plan: No  Review of Systems: Psychiatric: Agitation: No Hallucination: No Depressed Mood: No Insomnia: No Hypersomnia: No Altered Concentration: No Feels Worthless: No Grandiose Ideas: No Belief In Special Powers: No New/Increased Substance Abuse: No Compulsions: No  Neurologic: Headache: No Seizure: No Paresthesias: No  Medical History:  Patient has no active medical problems.  He has no primary care physician.    Outpatient Encounter Prescriptions as of 06/24/2015  Medication Sig  . VYVANSE 40 MG capsule Take 1 capsule (40 mg total) by mouth every morning.  . [DISCONTINUED] VYVANSE 40 MG capsule Take 1 capsule (40 mg total) by mouth every morning.  . [DISCONTINUED] VYVANSE 40 MG capsule  Take 1 capsule (40 mg total) by mouth every morning.   No facility-administered encounter medications on file as of 06/24/2015.    Past Psychiatric History/Hospitalization(s): Anxiety: No Bipolar Disorder: No Depression: No Mania: No Psychosis: No Schizophrenia: No Personality Disorder: No Hospitalization for psychiatric illness: No History of Electroconvulsive Shock Therapy: No Prior Suicide Attempts: No  Physical Exam: Constitutional:  BP 132/80 mmHg  Pulse 92  Ht 6' 1.75" (1.873 m)  Wt 170 lb 12.8 oz (77.474 kg)  BMI 22.08 kg/m2  No results found for this or any previous visit (from the past 2160 hour(s)).  General Appearance: well nourished  Musculoskeletal: Strength & Muscle Tone: within normal limits Gait & Station: normal Patient leans: N/A  Psychiatric: Psychiatric Specialty Exam: Physical Exam  Review of Systems  Constitutional: Negative for malaise/fatigue.  HENT: Negative.   Cardiovascular: Negative for chest pain and palpitations.  Musculoskeletal: Negative.   Neurological: Negative for dizziness, tingling, tremors and headaches.  Psychiatric/Behavioral: Negative for depression, suicidal ideas, hallucinations and substance abuse. The patient does not have insomnia.     Blood pressure 132/80, pulse 92, height 6' 1.75" (1.873 m), weight 170 lb 12.8 oz (77.474 kg).Body mass index is 22.08 kg/(m^2).  General Appearance: Casual and Tired  Eye Contact::  Good  Speech:  Slow  Volume:  Normal  Mood:  Euthymic  Affect:  Appropriate  Thought Process:  Coherent  Orientation:  Full (Time, Place, and Person)  Thought Content:  WDL  Suicidal Thoughts:  No  Homicidal Thoughts:  No  Memory:  Immediate;   Good Recent;   Good Remote;   Good  Judgement:  Good  Insight:  Good  Psychomotor Activity:  Normal  Concentration:  Good  Recall:  Good  Fund of Knowledge:  Good  Language:  Good  Akathisia:  No  Handed:  Right  AIMS (if indicated):     Assets:   Communication Skills Desire for Improvement Financial Resources/Insurance Housing Leisure Time Physical Health Social Support Vocational/Educational  ADL's:  Intact  Cognition:  WNL  Sleep:       Established Problem, Stable/Improving (1), Review of Last Therapy Session (1) and Review of Medication Regimen & Side Effects (2)  Assessment: Axis I: ADD  Axis II: Deferred  Axis III: No active medical problems  Plan:  Patient is stable on Vyvanse 40 mg.  he is taking Monday to Friday denies any concerns , tremors, shakes or any side effects.  Discussed medication side effects and benefits. Recommended to call us back if he has any questions or any concern.  I will see him in 3 months.  Gabriel Melena T., MD 06/24/2015

## 2015-09-23 ENCOUNTER — Ambulatory Visit (INDEPENDENT_AMBULATORY_CARE_PROVIDER_SITE_OTHER): Payer: PRIVATE HEALTH INSURANCE | Admitting: Psychiatry

## 2015-09-23 ENCOUNTER — Encounter (HOSPITAL_COMMUNITY): Payer: Self-pay | Admitting: Psychiatry

## 2015-09-23 VITALS — BP 122/68 | HR 63 | Ht 74.0 in | Wt 173.6 lb

## 2015-09-23 DIAGNOSIS — Z79899 Other long term (current) drug therapy: Secondary | ICD-10-CM

## 2015-09-23 DIAGNOSIS — F909 Attention-deficit hyperactivity disorder, unspecified type: Secondary | ICD-10-CM

## 2015-09-23 DIAGNOSIS — F988 Other specified behavioral and emotional disorders with onset usually occurring in childhood and adolescence: Secondary | ICD-10-CM

## 2015-09-23 MED ORDER — VYVANSE 40 MG PO CAPS: 40.0000 mg | ORAL_CAPSULE | Freq: Every morning | ORAL | Status: DC

## 2015-09-23 NOTE — Progress Notes (Signed)
[facility] [id number] Progress Note  Gabriel FuLarry Klein [id number] 30 y.o.  09/23/2015 9:01 AM  Chief Complaint: Medication management and followup.  History of Present Illness: Gabriel NajjarLarry came for his followup appointment.  He is taking his medication Monday to Friday and reported no side effects.  His job is going very well.  He moved to his new place after his roommate was arrested for drug charges.  Since moved he is very relaxed.  Next week he has a birthday and he is going for camping with his friends.  He is also planning to visit [place] to see his girlfriend.  Overall he described his mood is stable.  He denies any irritability, anger, mood swing.  His attention concentration is good.  He has no side effects including any tremors, insomnia, chest pain patient denies drinking alcohol or using any illegal substances.  His energy level is good.  His vitals are stable.  He likes his job.  He is taking Vyvanse 40 mg from Monday to Friday.   Suicidal Ideation: No Plan Formed: No Patient has means to carry out plan: No  Homicidal Ideation: No Plan Formed: No Patient has means to carry out plan: No  Review of Systems: Psychiatric: Agitation: No Hallucination: No Depressed Mood: No Insomnia: No Hypersomnia: No Altered Concentration: No Feels Worthless: No Grandiose Ideas: No Belief In Special Powers: No New/Increased Substance Abuse: No Compulsions: No  Neurologic: Headache: No Seizure: No Paresthesias: No  Medical History:  Patient has no active medical problems.  He has no primary care physician.    Outpatient Encounter Prescriptions as of 09/23/2015  Medication Sig  . VYVANSE 40 MG capsule Take 1 capsule (40 mg total) by mouth every morning.  . [DISCONTINUED] VYVANSE 40 MG capsule Take 1 capsule (40 mg total) by mouth every morning.  . [DISCONTINUED] VYVANSE 40 MG capsule Take 1 capsule (40 mg total) by mouth every morning.   No facility-administered encounter  medications on file as of 09/23/2015.    Past Psychiatric History/Hospitalization(s): Anxiety: No Bipolar Disorder: No Depression: No Mania: No Psychosis: No Schizophrenia: No Personality Disorder: No Hospitalization for psychiatric illness: No History of Electroconvulsive Shock Therapy: No Prior Suicide Attempts: No  Physical Exam: Constitutional:  BP 122/68 mmHg  Pulse 63  Ht 6\' 2"  (1.88 m)  Wt 173 lb 9.6 oz (78.744 kg)  BMI 22.28 kg/m2  No results found for this or any previous visit (from the past 2160 hour(s)).  General Appearance: well nourished  Musculoskeletal: Strength & Muscle Tone: within normal limits Gait & Station: normal Patient leans: N/A  Psychiatric: Psychiatric Specialty Exam: Physical Exam  Review of Systems  Constitutional: Negative for malaise/fatigue.  HENT: Negative.   Cardiovascular: Negative for chest pain and palpitations.  Musculoskeletal: Negative.   Neurological: Negative for dizziness, tingling, tremors and headaches.  Psychiatric/Behavioral: Negative for depression, suicidal ideas, hallucinations and substance abuse. The patient does not have insomnia.     Blood pressure 122/68, pulse 63, height 6\' 2"  (1.88 m), weight 173 lb 9.6 oz (78.744 kg).Body mass index is 22.28 kg/(m^2).  General Appearance: Casual and Tired  Eye Contact::  Good  Speech:  Slow  Volume:  Normal  Mood:  Euthymic  Affect:  Appropriate  Thought Process:  Coherent  Orientation:  Full (Time, Place, and Person)  Thought Content:  WDL  Suicidal Thoughts:  No  Homicidal Thoughts:  No  Memory:  Immediate;   Good Recent;   Good Remote;   Good  Judgement:  Good  Insight:  Good  Psychomotor Activity:  Normal  Concentration:  Good  Recall:  Good  Fund of Knowledge:  Good  Language:  Good  Akathisia:  No  Handed:  Right  AIMS (if indicated):     Assets:  Communication Skills Desire for Improvement Financial Resources/Insurance Housing Leisure  Time Physical Health Social Support Vocational/Educational  ADL's:  Intact  Cognition:  WNL  Sleep:       Established Problem, Stable/Improving (1), Review of Psycho-Social Stressors (1), Review or order clinical lab tests (1), Review of Last Therapy Session (1) and Review of Medication Regimen & Side Effects (2)  Assessment: Axis I: ADD  Axis II: Deferred  Axis III: No active medical problems  Plan:  Patient is stable on Vyvanse 40 mg. He reported no side effects.  He is taking Monday to Friday. Discussed medication side effects and benefits.  I will order CBC, CMP, hemoglobin A1c and TSH.  Recommended to call us back if he has any questions or any concern.  I will see him in 3 months.  Tysen Roesler T., MD 09/23/2015

## 2015-11-28 ENCOUNTER — Telehealth (HOSPITAL_COMMUNITY): Payer: Self-pay

## 2015-11-28 DIAGNOSIS — F988 Other specified behavioral and emotional disorders with onset usually occurring in childhood and adolescence: Secondary | ICD-10-CM

## 2015-11-28 MED ORDER — VYVANSE 40 MG PO CAPS: 40.0000 mg | ORAL_CAPSULE | Freq: Every morning | ORAL | Status: DC

## 2015-11-28 NOTE — Telephone Encounter (Signed)
Okay to refill? 

## 2015-11-28 NOTE — Telephone Encounter (Signed)
Patient is calling for a refill on Vyvanse, he is due and has a follow up on 7/28. Please review and advise, thank you

## 2015-11-28 NOTE — Telephone Encounter (Signed)
Printed and I will call patient to come and pick up

## 2015-12-23 ENCOUNTER — Ambulatory Visit (HOSPITAL_COMMUNITY): Payer: Self-pay | Admitting: Psychiatry

## 2016-01-05 ENCOUNTER — Telehealth (HOSPITAL_COMMUNITY): Payer: Self-pay

## 2016-01-05 DIAGNOSIS — F988 Other specified behavioral and emotional disorders with onset usually occurring in childhood and adolescence: Secondary | ICD-10-CM

## 2016-01-05 MED ORDER — VYVANSE 40 MG PO CAPS: 40.0000 mg | ORAL_CAPSULE | Freq: Every morning | ORAL | 0 refills | Status: DC

## 2016-01-05 NOTE — Telephone Encounter (Signed)
Yes refill it.

## 2016-01-05 NOTE — Telephone Encounter (Signed)
Patient has a follow up with Dr. Lolly MustacheArfeen on 8/31, he will need a refill on his Vyvanse. Please review and advise, thank you

## 2016-01-05 NOTE — Telephone Encounter (Signed)
Printed and called patient to come and pick up 

## 2016-01-09 ENCOUNTER — Telehealth (HOSPITAL_COMMUNITY): Payer: Self-pay

## 2016-01-09 NOTE — Telephone Encounter (Signed)
Peyton NajjarLarry picked up prescription on 1/61/098/14/17  lic [id number]  Dlo

## 2016-01-26 ENCOUNTER — Ambulatory Visit (HOSPITAL_COMMUNITY): Payer: Self-pay | Admitting: Psychiatry

## 2016-02-14 ENCOUNTER — Telehealth (HOSPITAL_COMMUNITY): Payer: Self-pay

## 2016-02-14 DIAGNOSIS — F988 Other specified behavioral and emotional disorders with onset usually occurring in childhood and adolescence: Secondary | ICD-10-CM

## 2016-02-14 MED ORDER — VYVANSE 40 MG PO CAPS: 40.0000 mg | ORAL_CAPSULE | Freq: Every morning | ORAL | 0 refills | Status: DC

## 2016-02-14 NOTE — Telephone Encounter (Signed)
Per Dr. Lolly MustacheArfeen the prescription was printed and signed, patient was called to come and pick up

## 2016-02-14 NOTE — Telephone Encounter (Signed)
4:34PM 02/14/16 Patient came to pick-up rx script WU#[id number]#[id number].Marland Kitchen.Marguerite Olea/sh

## 2016-02-14 NOTE — Telephone Encounter (Signed)
Okay to refill? 

## 2016-02-14 NOTE — Telephone Encounter (Signed)
Patient is calling for a refill on his Vyvanse, refill is appropriate, he has a follow up on 11/2. Okay to refill? Please review and advise, thank you

## 2016-03-07 ENCOUNTER — Ambulatory Visit (HOSPITAL_COMMUNITY): Payer: Self-pay | Admitting: Psychiatry

## 2016-03-27 ENCOUNTER — Telehealth (HOSPITAL_COMMUNITY): Payer: Self-pay

## 2016-03-27 ENCOUNTER — Other Ambulatory Visit (HOSPITAL_COMMUNITY): Payer: Self-pay | Admitting: Psychiatry

## 2016-03-27 MED ORDER — VYVANSE 40 MG PO CAPS: 40.0000 mg | ORAL_CAPSULE | Freq: Every morning | ORAL | 0 refills | Status: DC

## 2016-03-27 NOTE — Telephone Encounter (Signed)
Patient is calling for a refill on his Vyvanse, he states that he has a follow up on 11/2, but is out of the medication now - he also has to re-certify his credentials for [facility] and that includes a drug test and he will need a current prescription to show them. Please review and advise, thank you

## 2016-03-28 ENCOUNTER — Telehealth (HOSPITAL_COMMUNITY): Payer: Self-pay | Admitting: Psychiatry

## 2016-03-28 NOTE — Telephone Encounter (Signed)
Gabriel NajjarLarry picked up prescription on 14/78/2909/06/13 lic [id number] dlo

## 2016-03-29 ENCOUNTER — Ambulatory Visit (INDEPENDENT_AMBULATORY_CARE_PROVIDER_SITE_OTHER): Payer: PRIVATE HEALTH INSURANCE | Admitting: Psychiatry

## 2016-03-29 ENCOUNTER — Encounter (HOSPITAL_COMMUNITY): Payer: Self-pay | Admitting: Psychiatry

## 2016-03-29 VITALS — BP 112/78 | HR 76 | Ht 73.75 in | Wt 183.8 lb

## 2016-03-29 DIAGNOSIS — F900 Attention-deficit hyperactivity disorder, predominantly inattentive type: Secondary | ICD-10-CM

## 2016-03-29 MED ORDER — VYVANSE 40 MG PO CAPS: 40.0000 mg | ORAL_CAPSULE | Freq: Every morning | ORAL | 0 refills | Status: DC

## 2016-03-29 NOTE — Progress Notes (Signed)
[facility] [id number] [facility]  Gabriel FuLarry Klein [id number] 30 y.o.  03/29/2016 3:42 PM  Chief Complaint: Medication management and followup.  History of Present Illness: Gabriel NajjarLarry came for his followup appointment.  He is compliant with his psychiatric medication.  He just [place] from [place] from the conference and he really enjoyed care.  His attention and focus is good.  He is compliant with his Vyvanse from Monday to Friday and reported no side effects.  He has no tremors, shakes, insomnia, palpitation or any irritability.  His appetite is okay.  He has gained 10 pounds in past 6 months which she believed due to lack of exercise.  He denies any irritability, anger, mood swing.  His attention concentration is good.  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  He denies drinking alcohol or using any legal substances.  He wants to continue Vyvanse 40 mg from Monday to Friday.  Patient was given blood work on his last visit which he believes that he did but we do not have any results.  Suicidal Ideation: No Plan Formed: No Patient has means to carry out plan: No  Homicidal Ideation: No Plan Formed: No Patient has means to carry out plan: No  Review of Systems: Psychiatric: Agitation: No Hallucination: No Depressed Mood: No Insomnia: No Hypersomnia: No Altered Concentration: No Feels Worthless: No Grandiose Ideas: No Belief In Special Powers: No New/Increased Substance Abuse: No Compulsions: No  Neurologic: Headache: No Seizure: No Paresthesias: No  Medical History:  Patient has no active medical problems.  He has no primary care physician.    Outpatient Encounter Prescriptions as of 03/29/2016  Medication Sig  . VYVANSE 40 MG capsule Take 1 capsule (40 mg total) by mouth every morning. Do not refill until 04/27/16  . [DISCONTINUED] VYVANSE 40 MG capsule Take 1 capsule (40 mg total) by mouth every morning.   No facility-administered  encounter medications on file as of 03/29/2016.     Past Psychiatric History/Hospitalization(s): Anxiety: No Bipolar Disorder: No Depression: No Mania: No Psychosis: No Schizophrenia: No Personality Disorder: No Hospitalization for psychiatric illness: No History of Electroconvulsive Shock Therapy: No Prior Suicide Attempts: No  Physical Exam: Constitutional:  BP 112/78   Pulse 76   Ht 6' 1.75" (1.873 m)   Wt 183 lb 12.8 oz (83.4 kg)   BMI 23.76 kg/m   No results found for this or any previous visit (from the past 2160 hour(s)).  General Appearance: well nourished  Musculoskeletal: Strength & Muscle Tone: within normal limits Gait & Station: normal Patient leans: N/A  Psychiatric: Psychiatric Specialty Exam: Physical Exam  Review of Systems  Constitutional: Negative for malaise/fatigue.  HENT: Negative.   Cardiovascular: Negative for chest pain and palpitations.  Musculoskeletal: Negative.   Neurological: Negative for dizziness, tingling, tremors and headaches.  Psychiatric/Behavioral: Negative for depression, hallucinations, substance abuse and suicidal ideas. The patient does not have insomnia.     Blood pressure 112/78, pulse 76, height 6' 1.75" (1.873 m), weight 183 lb 12.8 oz (83.4 kg).Body mass index is 23.76 kg/m.  General Appearance: Casual and Tired  Eye Contact::  Good  Speech:  Slow  Volume:  Normal  Mood:  Euthymic  Affect:  Appropriate  Thought Process:  Coherent  Orientation:  Full (Time, Place, and Person)  Thought Content:  WDL  Suicidal Thoughts:  No  Homicidal Thoughts:  No  Memory:  Immediate;   Good Recent;   Good Remote;  Good  Judgement:  Good  Insight:  Good  Psychomotor Activity:  Normal  Concentration:  Good  Recall:  Good  Fund of Knowledge:  Good  Language:  Good  Akathisia:  No  Handed:  Right  AIMS (if indicated):     Assets:  Communication Skills Desire for Improvement Financial Resources/Insurance Housing Leisure  Time Physical Health Social Support Vocational/Educational  ADL's:  Intact  Cognition:  WNL  Sleep:       Established Problem, Stable/Improving (1), Review of Psycho-Social Stressors (1), Review or order clinical lab tests (1), Review of Last Therapy Session (1) and Review of Medication Regimen & Side Effects (2)  Assessment: Axis I: ADD  Axis II: Deferred  Axis III: No active medical problems  Plan:  Recommended to have his lab results faxed to us.  Patient will contact [facility] lab and sinusitis results .  He does not want to change his medication.  He is stable on Vyvanse 40 mg daily.  Discussed medication side effects and benefits.  Patient does not ask early refills for his stimulant.  Discussed stable and abuse, withdrawal, tolerance and detail.  Recommended to call us back if he has any question or any concern.  Follow-up in 3 months.  Tagg Eustice T., MD 03/29/2016

## 2016-06-29 ENCOUNTER — Encounter: Payer: Self-pay | Admitting: Psychiatry

## 2016-07-03 ENCOUNTER — Ambulatory Visit (INDEPENDENT_AMBULATORY_CARE_PROVIDER_SITE_OTHER): Payer: PRIVATE HEALTH INSURANCE | Admitting: Psychiatry

## 2016-07-03 ENCOUNTER — Encounter (HOSPITAL_COMMUNITY): Payer: Self-pay | Admitting: Psychiatry

## 2016-07-03 VITALS — BP 126/76 | HR 95 | Ht 73.75 in | Wt 187.0 lb

## 2016-07-03 DIAGNOSIS — F900 Attention-deficit hyperactivity disorder, predominantly inattentive type: Secondary | ICD-10-CM | POA: Diagnosis not present

## 2016-07-03 DIAGNOSIS — Z79899 Other long term (current) drug therapy: Secondary | ICD-10-CM | POA: Diagnosis not present

## 2016-07-03 MED ORDER — VYVANSE 40 MG PO CAPS: 40.0000 mg | ORAL_CAPSULE | Freq: Every morning | ORAL | 0 refills | Status: DC

## 2016-07-03 NOTE — Progress Notes (Signed)
BH MD/PA/NP OP Progress Note  07/03/2016 8:53 AM Gabriel Klein  MRN:  [id number]   Chief Complaint:  Subjective:  I am taking medication on weekdays.  It is helping me.  HPI: Gabriel Klein came for his follow-up appointment.  He's taking Vyvanse Monday to Friday and reported no side effects.  His attention and concentration is much better.  He is able to do multitasking.  He had a good Christmas.  Though he gained weight but he started running and feeling much better and healthier.  He had a blood work on February 2.  His CBC is normal.  His hemoglobin A1c 4.8.  His TSH is 1.50 and his metabolic panel is normal.  Patient denies any tremors, shakes, irritability, insomnia or any palpitation.  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  Patient denies using illegal substances.  His energy level is good.  His vital signs are stable.  Visit Diagnosis:    ICD-9-CM ICD-10-CM   1. Attention deficit hyperactivity disorder (ADHD), predominantly inattentive type 314.00 F90.0 VYVANSE 40 MG capsule     DISCONTINUED: VYVANSE 40 MG capsule    Past Psychiatric History: Reviewed.  Past Medical History:  Past Medical History:  Diagnosis Date  . ADHD (attention deficit hyperactivity disorder)     Past Surgical History:  Procedure Laterality Date  . h/o arm surgery      Family Psychiatric History: Reviewed.  Family History: No family history on file.  Social History:  Social History   Social History  . Marital status: Single    Spouse name: N/A  . Number of children: N/A  . Years of education: N/A   Social History Main Topics  . Smoking status: Never Smoker  . Smokeless tobacco: Never Used  . Alcohol use 0.0 oz/week     Comment: Occasional use  . Drug use: No  . Sexual activity: Yes    Partners: Female    Birth control/ protection: None   Other Topics Concern  . None   Social History Narrative  . None    Allergies: No Known Allergies  Metabolic Disorder Labs: On 06/29/2016,  he had blood work which shows hemoglobin A1c 4.8, TSH 1.570, glucose 109, creatinine 0.9.  His liver function test and basic chemistry is normal.  His CBC also normal. Lab Results  Component Value Date   HGBA1C 5.4 12/08/2014   MPG 108 12/08/2014   MPG 111 08/13/2013   No results found for: PROLACTIN No results found for: CHOL, TRIG, HDL, CHOLHDL, VLDL, LDLCALC   Current Medications: Current Outpatient Prescriptions  Medication Sig Dispense Refill  . VYVANSE 40 MG capsule Take 1 capsule (40 mg total) by mouth every morning. Do  Not refill until 07/31/16 30 capsule 0   No current facility-administered medications for this visit.     Neurologic: Headache: No Seizure: No Paresthesias: No  Musculoskeletal: Strength & Muscle Tone: within normal limits Gait & Station: normal Patient leans: N/A  Psychiatric Specialty Exam: Review of Systems  Constitutional: Negative.   HENT: Negative.   Eyes: Negative.   Respiratory: Negative.   Cardiovascular: Negative.   Gastrointestinal: Negative.   Musculoskeletal: Negative.   Skin: Negative.   Neurological: Negative.   Endo/Heme/Allergies: Negative.     Blood pressure 126/76, pulse 95, height 6' 1.75" (1.873 m), weight 187 lb (84.8 kg), SpO2 97 %.Body mass index is 24.17 kg/m.  General Appearance: Casual  Eye Contact:  Good  Speech:  Clear and Coherent  Volume:  Normal  Mood:  Euthymic  Affect:  Appropriate  Thought Process:  Goal Directed  Orientation:  Full (Time, Place, and Person)  Thought Content: WDL and Logical   Suicidal Thoughts:  No  Homicidal Thoughts:  No  Memory:  Immediate;   Good Recent;   Good Remote;   Good  Judgement:  Good  Insight:  Good  Psychomotor Activity:  Normal  Concentration:  Concentration: Fair and Attention Span: Good  Recall:  Good  Fund of Knowledge: Good  Language: Good  Akathisia:  No  Handed:  Right  AIMS (if indicated):  0  Assets:  Communication Skills Desire for  Improvement Housing Leisure Time Physical Health Resilience Social Support Talents/Skills  ADL's:  Intact  Cognition: WNL  Sleep:  good   Assessment: Attention deficit disorder inattentive type  Plan: Patient is doing better on his current medication.  His attention, focus and multitasking is good with Vyvanse 40 mg.  He has no side effects.  I review his blood work.  I will continue Vyvanse 40 mg which he takes Monday to Friday.  Recommended to call us back if he has any question, concern if he feel worsening of the symptom.  Discussed medication side effects.  Recommended follow-up in 3 months.   Aniayah Alaniz T., MD 07/03/2016, 8:53 AM

## 2016-09-27 ENCOUNTER — Ambulatory Visit (INDEPENDENT_AMBULATORY_CARE_PROVIDER_SITE_OTHER): Payer: PRIVATE HEALTH INSURANCE | Admitting: Psychiatry

## 2016-09-27 ENCOUNTER — Encounter (HOSPITAL_COMMUNITY): Payer: Self-pay | Admitting: Psychiatry

## 2016-09-27 DIAGNOSIS — Z79899 Other long term (current) drug therapy: Secondary | ICD-10-CM

## 2016-09-27 DIAGNOSIS — F900 Attention-deficit hyperactivity disorder, predominantly inattentive type: Secondary | ICD-10-CM

## 2016-09-27 MED ORDER — VYVANSE 40 MG PO CAPS: 40.0000 mg | ORAL_CAPSULE | Freq: Every morning | ORAL | 0 refills | Status: DC

## 2016-09-27 NOTE — Progress Notes (Signed)
BH MD/PA/NP OP Progress Note  09/27/2016 8:37 AM Gabriel Klein  MRN:  [id number]  Chief Complaint:  Subjective:  I am doing good.  I'm taking medication on weekdays.  HPI: He came for his follow-up appointment.  He is doing good on his medication.  He is taking Vyvanse Monday to Friday and that seems to be working very well.  He really enjoyed his job.  He is more productive and recently his supervisor was very happy with his productivity.  He sleeping good.  He denies any irritability, mania, psychosis or any hallucination.  He denies any chest pain or any palpitation.  He is excited about his birthday coming this Sunday.  He will see his parents and have dinner tonight.  His relationship with the girlfriend is going very well.  Patient denies drinking alcohol or using any illegal substances.  His energy level is good.  He has no tremors, shakes or any EPS.  He is working as a PA at [employer].  Patient has no issues with Vyvanse.  Visit Diagnosis:    ICD-9-CM ICD-10-CM   1. Attention deficit hyperactivity disorder (ADHD), predominantly inattentive type 314.00 F90.0 VYVANSE 40 MG capsule     DISCONTINUED: VYVANSE 40 MG capsule    Past Psychiatric History: Patient has history of ADD and diagnosed since and is a school age.  In the past he had tried Ritalin, Strattera, Adderall but these medicine stopped working after a while.  Patient denies any history of psychiatric inpatient treatment, mania, psychosis, hallucination.  Past Medical History:  Past Medical History:  Diagnosis Date  . ADHD (attention deficit hyperactivity disorder)     Past Surgical History:  Procedure Laterality Date  . h/o arm surgery      Family Psychiatric History: Reviewed.  Family History: History reviewed. No pertinent family history.  Social History:  Social History   Social History  . Marital status: Single    Spouse name: N/A  . Number of children: N/A  . Years of education: N/A   Social  History Main Topics  . Smoking status: Never Smoker  . Smokeless tobacco: Never Used  . Alcohol use 0.0 oz/week     Comment: Occasional use  . Drug use: No  . Sexual activity: Yes    Partners: Female    Birth control/ protection: None   Other Topics Concern  . None   Social History Narrative  . None    Allergies: No Known Allergies  Metabolic Disorder Labs: On 06/29/2016, he had blood work which shows hemoglobin A1c 4.8, TSH 1.570, glucose 109, creatinine 0.9.  His liver function test and basic chemistry is normal.  His CBC also normal.  Lab Results  Component Value Date   HGBA1C 5.4 12/08/2014   MPG 108 12/08/2014   MPG 111 08/13/2013   No results found for: PROLACTIN No results found for: CHOL, TRIG, HDL, CHOLHDL, VLDL, LDLCALC   Current Medications: Current Outpatient Prescriptions  Medication Sig Dispense Refill  . VYVANSE 40 MG capsule Take 1 capsule (40 mg total) by mouth every morning. Do  Not refill until 07/31/16 30 capsule 0   No current facility-administered medications for this visit.     Neurologic: Headache: No Seizure: No Paresthesias: No  Musculoskeletal: Strength & Muscle Tone: within normal limits Gait & Station: normal Patient leans: N/A  Psychiatric Specialty Exam: ROS  Blood pressure 120/78, pulse 68, height 6' 1.75" (1.873 m), weight 189 lb 3.2 oz (85.8 kg).Body mass index is 24.46  kg/m.  General Appearance: Casual  Eye Contact:  Good  Speech:  Clear and Coherent  Volume:  Normal  Mood:  Euthymic  Affect:  Congruent  Thought Process:  Goal Directed  Orientation:  Full (Time, Place, and Person)  Thought Content: WDL and Logical   Suicidal Thoughts:  No  Homicidal Thoughts:  No  Memory:  Immediate;   Good Recent;   Good Remote;   Good  Judgement:  Good  Insight:  Good  Psychomotor Activity:  Normal  Concentration:  Concentration: Good and Attention Span: Good  Recall:  Good  Fund of Knowledge: Good  Language: Good  Akathisia:   No  Handed:  Right  AIMS (if indicated):  0  Assets:  Communication Skills Desire for Improvement Housing Intimacy Physical Health Resilience Social Support Talents/Skills Vocational/Educational  ADL's:  Intact  Cognition: WNL  Sleep:  Good    Assessment: Attention deficit disorder, predominantly inattentive type  Plan: Patient is a stable on his current Vyvanse.  He does not ask for early refills.  He has no side effects.  I will continue Vyvanse 40 mg Monday to Friday.  Recommended to call us back if he has any question or any concern.  Follow-up in 3 months.  Kinaya Hilliker T., MD 09/27/2016, 8:37 AM

## 2016-12-31 ENCOUNTER — Ambulatory Visit (INDEPENDENT_AMBULATORY_CARE_PROVIDER_SITE_OTHER): Payer: PRIVATE HEALTH INSURANCE | Admitting: Psychiatry

## 2016-12-31 ENCOUNTER — Encounter (HOSPITAL_COMMUNITY): Payer: Self-pay | Admitting: Psychiatry

## 2016-12-31 DIAGNOSIS — F900 Attention-deficit hyperactivity disorder, predominantly inattentive type: Secondary | ICD-10-CM

## 2016-12-31 MED ORDER — VYVANSE 40 MG PO CAPS: 40.0000 mg | ORAL_CAPSULE | Freq: Every morning | ORAL | 0 refills | Status: DC

## 2016-12-31 NOTE — Progress Notes (Addendum)
BH MD/PA/NP OP Progress Note  12/31/2016 8:46 AM Gabriel FuLarry Klein  MRN:  [id number]  Chief Complaint: I am doing good on medication.  Subjective:   HPI: Patient came for his follow-up appointment.  He's taking the medication without any side effects.  [place] went to [place]. His attention, focus is good.  He is able to do multitasking.  He denies any mania, psychosis, hallucination.  Denies any chest pain or any palpitation. Patient denies drinking alcohol or using any illegal substances.  His energy level is good.  He has no tremors shakes or EPS.  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  He is taking Vyvanse Monday to Friday.   Visit Diagnosis:    ICD-10-CM   1. Attention deficit hyperactivity disorder (ADHD), predominantly inattentive type F90.0 VYVANSE 40 MG capsule    DISCONTINUED: VYVANSE 40 MG capsule    Past Psychiatric History: reviewed. Patient has history of ADD and diagnosed since and is a school age.  In the past he had tried Ritalin, Strattera, Adderall but these medicine stopped working after a while.  Patient denies any history of psychiatric inpatient treatment, mania, psychosis, hallucination.  Past Medical History:  Past Medical History:  Diagnosis Date  . ADHD (attention deficit hyperactivity disorder)     Past Surgical History:  Procedure Laterality Date  . h/o arm surgery      Family Psychiatric History: reviewed.  Family History: History reviewed. No pertinent family history.  Social History:  Social History   Social History  . Marital status: Single    Spouse name: N/A  . Number of children: N/A  . Years of education: N/A   Social History Main Topics  . Smoking status: Never Smoker  . Smokeless tobacco: Never Used  . Alcohol use 0.0 oz/week     Comment: Occasional use  . Drug use: No  . Sexual activity: Yes    Partners: Female    Birth control/ protection: None   Other Topics Concern  . None   Social History  Narrative  . None    Allergies: No Known Allergies  Metabolic Disorder Labs: Lab Results  Component Value Date   HGBA1C 5.4 12/08/2014   MPG 108 12/08/2014   MPG 111 08/13/2013   No results found for: PROLACTIN No results found for: CHOL, TRIG, HDL, CHOLHDL, VLDL, LDLCALC   Current Medications: Current Outpatient Prescriptions  Medication Sig Dispense Refill  . VYVANSE 40 MG capsule Take 1 capsule (40 mg total) by mouth every morning. Do  Not refill until 10/28/16 30 capsule 0   No current facility-administered medications for this visit.     Neurologic: Headache: No Seizure: No Paresthesias: No  Musculoskeletal: Strength & Muscle Tone: within normal limits Gait & Station: normal Patient leans: N/A  Psychiatric Specialty Exam: ROS  Blood pressure 122/70, pulse 61, height 6' 1.75" (1.873 m), weight 193 lb 9.6 oz (87.8 kg).Body mass index is 25.03 kg/m.  General Appearance: Casual  Eye Contact:  Good  Speech:  Clear and Coherent  Volume:  Normal  Mood:  Euthymic  Affect:  Appropriate  Thought Process:  Goal Directed  Orientation:  Full (Time, Place, and Person)  Thought Content: Logical   Suicidal Thoughts:  No  Homicidal Thoughts:  No  Memory:  Immediate;   Good Recent;   Good Remote;   Good  Judgement:  Good  Insight:  Good  Psychomotor Activity:  Normal  Concentration:  Concentration: Good and Attention Span:  Good  Recall:  Good  Fund of Knowledge: Good  Language: Good  Akathisia:  No  Handed:  Right  AIMS (if indicated):  0  Assets:  Communication Skills Desire for Improvement Housing Social Support  ADL's:  Intact  Cognition: WNL  Sleep:  ok    Assessment: Attention deficit disorder, inattentive type.  Plan: Patient is stable on Vyvanse.  He takes Monday to Friday.  He has no issues.  He has no tremors shakes.he does not ask for early refills.  I will continue Vyvanse 40 mg.  Recommended to call us back if he has any question or any concern.   Follow-up in 3 months.  ARFEEN,SYED T., MD 12/31/2016, 8:46 AM

## 2017-04-02 ENCOUNTER — Encounter (HOSPITAL_COMMUNITY): Payer: Self-pay | Admitting: Psychiatry

## 2017-04-02 ENCOUNTER — Ambulatory Visit (INDEPENDENT_AMBULATORY_CARE_PROVIDER_SITE_OTHER): Payer: PRIVATE HEALTH INSURANCE | Admitting: Psychiatry

## 2017-04-02 DIAGNOSIS — F900 Attention-deficit hyperactivity disorder, predominantly inattentive type: Secondary | ICD-10-CM | POA: Diagnosis not present

## 2017-04-02 MED ORDER — VYVANSE 40 MG PO CAPS: 40.0000 mg | ORAL_CAPSULE | Freq: Every morning | ORAL | 0 refills | Status: DC

## 2017-04-02 NOTE — Progress Notes (Signed)
BH MD/PA/NP OP Progress Note  04/02/2017 8:25 AM Anne FuLarry Binns  MRN:  [id number]  Chief Complaint: I am doing good.  My medicines working.  HPI: Gabriel Klein came for his follow-up appointment.  He is taking his medication as prescribed.  He takes Vyvanse Monday to Friday.  His attention, focus is good.  He is able to do multitasking.  He denies any irritability, anger, mania or any psychosis.  His energy level is good.  He denies any crying spells or any feeling of hopelessness or worthlessness.  He is sleeping good.  He denies drinking alcohol or using any illegal substances.  His appetite is okay.  He has no tremors, shakes or any EPS.  He enjoys working.  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  His vital signs are stable.  Visit Diagnosis:    ICD-10-CM   1. Attention deficit hyperactivity disorder (ADHD), predominantly inattentive type F90.0 VYVANSE 40 MG capsule    DISCONTINUED: VYVANSE 40 MG capsule    Past Psychiatric History: Reviewed Patient has ADD since school age. In the past he had tried Ritalin, Strattera, Adderall but these medicine stopped working after a while. Patient denies any history of psychiatric inpatient treatment, mania, psychosis, hallucination.  Past Medical History:  Past Medical History:  Diagnosis Date  . ADHD (attention deficit hyperactivity disorder)     Past Surgical History:  Procedure Laterality Date  . h/o arm surgery      Family Psychiatric History:  Reviewed  Family History: No family history on file.  Social History:  Social History   Socioeconomic History  . Marital status: Single    Spouse name: Not on file  . Number of children: Not on file  . Years of education: Not on file  . Highest education level: Not on file  Social Needs  . Financial resource strain: Not on file  . Food insecurity - worry: Not on file  . Food insecurity - inability: Not on file  . Transportation needs - medical: Not on file  . Transportation needs -  non-medical: Not on file  Occupational History  . Not on file  Tobacco Use  . Smoking status: Never Smoker  . Smokeless tobacco: Never Used  Substance and Sexual Activity  . Alcohol use: Yes    Alcohol/week: 0.0 oz    Comment: Occasional use  . Drug use: No  . Sexual activity: Yes    Partners: Female    Birth control/protection: None  Other Topics Concern  . Not on file  Social History Narrative  . Not on file    Allergies: No Known Allergies  Metabolic Disorder Labs: Lab Results  Component Value Date   HGBA1C 5.4 12/08/2014   MPG 108 12/08/2014   MPG 111 08/13/2013   No results found for: PROLACTIN No results found for: CHOL, TRIG, HDL, CHOLHDL, VLDL, LDLCALC Lab Results  Component Value Date   TSH 1.250 12/08/2014    Therapeutic Level Labs: No results found for: LITHIUM No results found for: VALPROATE No components found for:  CBMZ  Current Medications: Current Outpatient Medications  Medication Sig Dispense Refill  . VYVANSE 40 MG capsule Take 1 capsule (40 mg total) by mouth every morning. Do  Not refill until 01/28/17 30 capsule 0   No current facility-administered medications for this visit.      Musculoskeletal: Strength & Muscle Tone: within normal limits Gait & Station: normal Patient leans: N/A  Psychiatric Specialty Exam: ROS  There were no  vitals taken for this visit.There is no height or weight on file to calculate BMI.  General Appearance: Casual  Eye Contact:  Good  Speech:  Clear and Coherent  Volume:  Normal  Mood:  Euthymic  Affect:  Appropriate  Thought Process:  Goal Directed  Orientation:  Full (Time, Place, and Person)  Thought Content: Logical   Suicidal Thoughts:  No  Homicidal Thoughts:  No  Memory:  Immediate;   Good Recent;   Good Remote;   Good  Judgement:  Good  Insight:  Good  Psychomotor Activity:  Normal  Concentration:  Concentration: Fair and Attention Span: Fair  Recall:  Good  Fund of Knowledge: Good   Language: Good  Akathisia:  No  Handed:  Right  AIMS (if indicated): not done  Assets:  Communication Skills Desire for Improvement Housing Talents/Skills Transportation  ADL's:  Intact  Cognition: WNL  Sleep:  Good   Screenings:   Assessment and Plan: Attention deficit disorder, inattentive type.   Patient doing very well on Vyvanse 40 mg which he takes Monday to Friday.  He has no tremors, shakes or any EPS.  He does not ask for early refills.  He is not interested in counseling.  Continue Vyvanse 40 mg Monday to Friday.  Recommended to call us back if is any question, concern or if he feels worsening of the symptoms.  Follow-up in 3 months.  Symiah Nowotny T., MD 04/02/2017, 8:25 AM

## 2017-07-03 ENCOUNTER — Ambulatory Visit (INDEPENDENT_AMBULATORY_CARE_PROVIDER_SITE_OTHER): Payer: PRIVATE HEALTH INSURANCE | Admitting: Psychiatry

## 2017-07-03 ENCOUNTER — Encounter (HOSPITAL_COMMUNITY): Payer: Self-pay | Admitting: Psychiatry

## 2017-07-03 DIAGNOSIS — F900 Attention-deficit hyperactivity disorder, predominantly inattentive type: Secondary | ICD-10-CM | POA: Diagnosis not present

## 2017-07-03 MED ORDER — VYVANSE 40 MG PO CAPS: 40.0000 mg | ORAL_CAPSULE | Freq: Every morning | ORAL | 0 refills | Status: DC

## 2017-07-03 NOTE — Progress Notes (Signed)
BH MD/PA/NP OP Progress Note  07/03/2017 8:31 AM Gabriel Klein  MRN:  [id number]  Chief Complaint: I am doing good.  I have a good Christmas.  HPI: Gabriel Klein came for his follow-up appointment.  He is compliant with Vyvanse Monday to Friday.  He is doing his job very well.  He has no issues at work.  His attention, concentration and multitasking is good.  He is able to finish his job on time.  He endorses supervisor happy with him.  He may start working 1 day a week in [place] as they are expanding.  Recently he bought a new mattress and he is sleeping much better than before.  He denies any irritability, anger, mania or any psychosis.  His sleep is good.  He has no tremors shakes or any EPS.  His energy level is good.  His appetite is okay.  Patient denies drinking alcohol or using any illegal substances.  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  Visit Diagnosis:    ICD-10-CM   1. Attention deficit hyperactivity disorder (ADHD), predominantly inattentive type F90.0 VYVANSE 40 MG capsule    DISCONTINUED: VYVANSE 40 MG capsule    Past Psychiatric History: Viewed. Patient has ADD since school age. In the past he had tried Ritalin, Strattera, Adderall but these medicine stopped working after a while. Patient denies any history of psychiatric inpatient treatment, mania, psychosis, hallucination.  Past Medical History:  Past Medical History:  Diagnosis Date  . ADHD (attention deficit hyperactivity disorder)     Past Surgical History:  Procedure Laterality Date  . h/o arm surgery      Family Psychiatric History: Viewed.  Family History: No family history on file.  Social History:  Social History   Socioeconomic History  . Marital status: Single    Spouse name: Not on file  . Number of children: Not on file  . Years of education: Not on file  . Highest education level: Not on file  Social Needs  . Financial resource strain: Not hard at all  . Food insecurity - worry:  Never true  . Food insecurity - inability: Never true  . Transportation needs - medical: No  . Transportation needs - non-medical: No  Occupational History  . Not on file  Tobacco Use  . Smoking status: Never Smoker  . Smokeless tobacco: Never Used  Substance and Sexual Activity  . Alcohol use: Yes    Alcohol/week: 0.0 oz    Comment: Occasional use  . Drug use: No  . Sexual activity: Yes    Partners: Female    Birth control/protection: None  Other Topics Concern  . Not on file  Social History Narrative  . Not on file    Allergies: No Known Allergies  Metabolic Disorder Labs: Lab Results  Component Value Date   HGBA1C 5.4 12/08/2014   MPG 108 12/08/2014   MPG 111 08/13/2013   No results found for: PROLACTIN No results found for: CHOL, TRIG, HDL, CHOLHDL, VLDL, LDLCALC Lab Results  Component Value Date   TSH 1.250 12/08/2014    Therapeutic Level Labs: No results found for: LITHIUM No results found for: VALPROATE No components found for:  CBMZ  Current Medications: Current Outpatient Medications  Medication Sig Dispense Refill  . VYVANSE 40 MG capsule Take 1 capsule (40 mg total) every morning by mouth. Do  Not refill until 05/02/17 30 capsule 0   No current facility-administered medications for this visit.  Musculoskeletal: Strength & Muscle Tone: within normal limits Gait & Station: normal Patient leans: N/A  Psychiatric Specialty Exam: ROS  Blood pressure 118/68, pulse 78, height 6\' 2"  (1.88 m), weight 187 lb 9.6 oz (85.1 kg).There is no height or weight on file to calculate BMI.  General Appearance: Casual  Eye Contact:  Good  Speech:  Clear and Coherent  Volume:  Normal  Mood:  Anxious  Affect:  Appropriate  Thought Process:  Goal Directed  Orientation:  Full (Time, Place, and Person)  Thought Content: Logical   Suicidal Thoughts:  No  Homicidal Thoughts:  No  Memory:  Immediate;   Good Recent;   Good Remote;   Good  Judgement:  Good   Insight:  Good  Psychomotor Activity:  Normal  Concentration:  Concentration: Good and Attention Span: Good  Recall:  Good  Fund of Knowledge: Good  Language: Good  Akathisia:  No  Handed:  Right  AIMS (if indicated): not done  Assets:  Communication Skills Desire for Improvement Housing Resilience Social Support Talents/Skills Transportation  ADL's:  Intact  Cognition: WNL  Sleep:  Good   Screenings:   Assessment and Plan: Attention deficit disorder, inattentive type.  Patient doing well on his Vyvanse 40 mg which he takes Monday to Friday.  He has no side effects or any concern.  He is not interested in counseling.  He does not ask early refills for stimulants.  We will order CBC, CMP, hemoglobin A 1C TSH and he will do his blood work at his office.  Recommended to call us back if is any question or any concern.  Follow-up in 3 months.   Cleotis NipperSyed T Yovanna Cogan, MD 07/03/2017, 8:31 AM

## 2017-10-01 ENCOUNTER — Encounter (HOSPITAL_COMMUNITY): Payer: Self-pay | Admitting: Psychiatry

## 2017-10-01 ENCOUNTER — Ambulatory Visit (INDEPENDENT_AMBULATORY_CARE_PROVIDER_SITE_OTHER): Payer: PRIVATE HEALTH INSURANCE | Admitting: Psychiatry

## 2017-10-01 DIAGNOSIS — F900 Attention-deficit hyperactivity disorder, predominantly inattentive type: Secondary | ICD-10-CM

## 2017-10-01 MED ORDER — VYVANSE 40 MG PO CAPS: 40.0000 mg | ORAL_CAPSULE | Freq: Every morning | ORAL | 0 refills | Status: DC

## 2017-10-01 NOTE — Progress Notes (Signed)
BH MD/PA/NP OP Progress Note  10/01/2017 8:43 AM Gabriel Klein  MRN:  [id number]  Chief Complaint: I am doing better on my medication.  HPI: Gabriel Klein came for his follow-up appointment.  He is compliant with Vyvanse Monday to Friday.  He reported medicine working very well.  He has no insomnia or irritability.  He is able to do multitasking.  His attention concentration is improved.  He is sleeping good.  He has no tremors shakes or any EPS.  He is excited about upcoming trip to [place] to attend his girlfriend sister's wedding.  His relationship is going very well.  He denies drinking alcohol or using any illegal substances.  His appetite is okay.  His energy level is good.  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  Visit Diagnosis:    ICD-10-CM   1. Attention deficit hyperactivity disorder (ADHD), predominantly inattentive type F90.0 VYVANSE 40 MG capsule    DISCONTINUED: VYVANSE 40 MG capsule    Past Psychiatric History: Reviewed Patient has ADD since school age. In the past he had tried Ritalin, Strattera, Adderall but these medicine stopped working after a while. Patient denies any history of psychiatric inpatient treatment, mania, psychosis, hallucination.  Past Medical History:  Past Medical History:  Diagnosis Date  . ADHD (attention deficit hyperactivity disorder)     Past Surgical History:  Procedure Laterality Date  . h/o arm surgery      Family Psychiatric History: Viewed  Family History: No family history on file.  Social History:  Social History   Socioeconomic History  . Marital status: Single    Spouse name: Not on file  . Number of children: Not on file  . Years of education: Not on file  . Highest education level: Not on file  Occupational History  . Not on file  Social Needs  . Financial resource strain: Not hard at all  . Food insecurity:    Worry: Never true    Inability: Never true  . Transportation needs:    Medical: No   Non-medical: No  Tobacco Use  . Smoking status: Never Smoker  . Smokeless tobacco: Never Used  Substance and Sexual Activity  . Alcohol use: Yes    Alcohol/week: 0.0 oz    Comment: Occasional use  . Drug use: No  . Sexual activity: Yes    Partners: Female    Birth control/protection: None  Lifestyle  . Physical activity:    Days per week: 3 days    Minutes per session: 50 min  . Stress: Only a little  Relationships  . Social connections:    Talks on phone: More than three times a week    Gets together: Once a week    Attends religious service: Never    Active member of club or organization: No    Attends meetings of clubs or organizations: Never    Relationship status: Never married  Other Topics Concern  . Not on file  Social History Narrative  . Not on file    Allergies: No Known Allergies  Metabolic Disorder Labs: Lab Results  Component Value Date   HGBA1C 5.4 12/08/2014   MPG 108 12/08/2014   MPG 111 08/13/2013   No results found for: PROLACTIN No results found for: CHOL, TRIG, HDL, CHOLHDL, VLDL, LDLCALC Lab Results  Component Value Date   TSH 1.250 12/08/2014    Therapeutic Level Labs: No results found for: LITHIUM No results found for: VALPROATE No components found  for:  CBMZ  Current Medications: Current Outpatient Medications  Medication Sig Dispense Refill  . VYVANSE 40 MG capsule Take 1 capsule (40 mg total) by mouth every morning. Do  Not refill until 07/31/17 30 capsule 0   No current facility-administered medications for this visit.      Musculoskeletal: Strength & Muscle Tone: within normal limits Gait & Station: normal Patient leans: N/A  Psychiatric Specialty Exam: ROS  Blood pressure 124/70, pulse 64, height  (1.854 m), weight 192 lb (87.1 kg).There is no height or weight on file to calculate BMI.  General Appearance: Casual  Eye Contact:  Good  Speech:  Clear and Coherent  Volume:  Normal  Mood:  Euthymic  Affect:   Appropriate  Thought Process:  Goal Directed  Orientation:  Full (Time, Place, and Person)  Thought Content: Logical   Suicidal Thoughts:  No  Homicidal Thoughts:  No  Memory:  Immediate;   Good Recent;   Good Remote;   Good  Judgement:  Good  Insight:  Good  Psychomotor Activity:  Normal  Concentration:  Concentration: Good and Attention Span: Good  Recall:  Good  Fund of Knowledge: Good  Language: Good  Akathisia:  No  Handed:  Right  AIMS (if indicated): not done  Assets:  Communication Skills Desire for Improvement Housing Resilience Social Support  ADL's:  Intact  Cognition: WNL  Sleep:  Good   Screenings:   Assessment and Plan: Attention deficit disorder, inattentive type.  Patient doing well on his current Vyvanse 40 mg which he takes Monday to Friday.  He has no side effects.  He is not interested in counseling.  I will order CBC, CMP and hemoglobin A 1C and he will do blood work at his office.  Discussed medication side effects and benefits especially stimulant abuse, tolerance and withdrawal.  Recommended to call us back if he has any question or any concern.  Follow-up in 3 months.   Cleotis Nipper, MD 10/01/2017, 8:43 AM

## 2017-12-30 ENCOUNTER — Telehealth (HOSPITAL_COMMUNITY): Payer: Self-pay

## 2017-12-30 DIAGNOSIS — F900 Attention-deficit hyperactivity disorder, predominantly inattentive type: Secondary | ICD-10-CM

## 2017-12-30 MED ORDER — VYVANSE 40 MG PO CAPS: 40.0000 mg | ORAL_CAPSULE | Freq: Every morning | ORAL | 0 refills | Status: DC

## 2017-12-30 NOTE — Telephone Encounter (Signed)
Sure I will send it now

## 2017-12-30 NOTE — Telephone Encounter (Signed)
Gabriel Klein patient rescheduled and needs a refill on his Vyvanse 40 mg sent into [facility] [address]. Please review and advise, thank you

## 2018-01-01 ENCOUNTER — Ambulatory Visit (HOSPITAL_COMMUNITY): Payer: Self-pay | Admitting: Psychiatry

## 2018-02-12 ENCOUNTER — Ambulatory Visit (INDEPENDENT_AMBULATORY_CARE_PROVIDER_SITE_OTHER): Payer: PRIVATE HEALTH INSURANCE | Admitting: Psychiatry

## 2018-02-12 ENCOUNTER — Encounter (HOSPITAL_COMMUNITY): Payer: Self-pay | Admitting: Psychiatry

## 2018-02-12 VITALS — BP 128/74 | HR 80 | Ht 73.75 in | Wt 185.0 lb

## 2018-02-12 DIAGNOSIS — Z79899 Other long term (current) drug therapy: Secondary | ICD-10-CM | POA: Diagnosis not present

## 2018-02-12 DIAGNOSIS — F900 Attention-deficit hyperactivity disorder, predominantly inattentive type: Secondary | ICD-10-CM

## 2018-02-12 MED ORDER — VYVANSE 40 MG PO CAPS: 40.0000 mg | ORAL_CAPSULE | Freq: Every morning | ORAL | 0 refills | Status: DC

## 2018-02-12 MED ORDER — LISDEXAMFETAMINE DIMESYLATE 40 MG PO CAPS: 40.0000 mg | ORAL_CAPSULE | ORAL | 0 refills | Status: DC

## 2018-02-12 NOTE — Progress Notes (Signed)
BH MD/PA/NP OP Progress Note  02/12/2018 8:55 AM Gabriel Klein  MRN:  [id number]  Chief Complaint: I am doing really well  HPI: Gabriel Klein came for his follow-up appointment.  He is compliant with Vyvanse Monday to Friday, and occasionally will use on a weekend.  He reports being on stimulants since 1st grade, and changed to Vyvanse in 2014 which he reports is much easier to tolerate.  He reported medicine working very well without side effect. His attention concentration is improved.  He has a good appetite, and no problems with sleep.  He reports bedtime at 9 PM and wakes up feeling rested.  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   He is able to do multitasking. He lives with his girlfriend of 3 years and a cat, and describes a supportive relationship.  He enjoyed his trip to [place]. No plans for travel before next visit.  He denies any irritability, anger, psychosis. He denies any specific phobias, or panic attacks. He denies any crying spells or any feeling of hopelessness or worthlessness. He denies mania or hypomania with racing thoughts, pressured speech, hyperactivity, grandiosity, distractibility, or changes in sleep or appetite.   He denies SI, HI, self harm thoughts or AVH. He is tolerating medication without any side effects.  He has no tremors, shakes or any EPS.   Visit Diagnosis:    ICD-10-CM   1. Attention deficit hyperactivity disorder (ADHD), predominantly inattentive type F90.0   2. Encounter for medication management Z79.899     Past Psychiatric History: Reviewed Patient has ADD since 1st grade. In the past he had tried Ritalin, Strattera, Adderall but these medicine stopped working after a while.  Strattera caused nausea Patient denies any history of psychiatric inpatient treatment, mania, psychosis, hallucination.  Past Medical History:  Past Medical History:  Diagnosis Date  . ADHD (attention  deficit hyperactivity disorder)     Past Surgical History:  Procedure Laterality Date  . h/o arm surgery      Family Psychiatric History: Reviewed. He is an only child  Family History: History reviewed. No pertinent family history.  Social History:  Lives with girlfriend and cat Parents living and supportive No siblings  Social History   Socioeconomic History  . Marital status: Single    Spouse name: Not on file  . Number of children: Not on file  . Years of education: Not on file  . Highest education level: Not on file  Occupational History  . Not on file  Social Needs  . Financial resource strain: Not hard at all  . Food insecurity:    Worry: Never true    Inability: Never true  . Transportation needs:    Medical: No    Non-medical: No  Tobacco Use  . Smoking status: Never Smoker  . Smokeless tobacco: Never Used  Substance and Sexual Activity  . Alcohol use: Yes    Alcohol/week: 0.0 standard drinks    Comment: Occasional use  . Drug use: No  . Sexual activity: Yes    Partners: Female    Birth control/protection: None  Lifestyle  . Physical activity:    Days per week: 3 days    Minutes per session: 50 min  . Stress: Only a little  Relationships  . Social connections:    Talks on phone: More than three times a week    Gets together: Once a week  Attends religious service: Never    Active member of club or organization: No    Attends meetings of clubs or organizations: Never    Relationship status: Never married  Other Topics Concern  . Not on file  Social History Narrative  . Not on file    Allergies: No Known Allergies  Metabolic Disorder Labs: Lab Results  Component Value Date   HGBA1C 5.4 12/08/2014   MPG 108 12/08/2014   MPG 111 08/13/2013   No results found for: PROLACTIN No results found for: CHOL, TRIG, HDL, CHOLHDL, VLDL, LDLCALC Lab Results  Component Value Date   TSH 1.250 12/08/2014    Therapeutic Level Labs: No results  found for: LITHIUM No results found for: VALPROATE No components found for:  CBMZ  Current Medications: Current Outpatient Medications  Medication Sig Dispense Refill  . VYVANSE 40 MG capsule Take 1 capsule (40 mg total) by mouth every morning. 30 capsule 0   No current facility-administered medications for this visit.      Musculoskeletal: Strength & Muscle Tone: within normal limits Gait & Station: normal Patient leans: N/A  Psychiatric Specialty Exam: Review of Systems  Constitutional: Negative.   Respiratory: Negative.   Cardiovascular: Negative.   Gastrointestinal: Negative.   Genitourinary: Negative.   Musculoskeletal: Negative.   Neurological: Negative.   Psychiatric/Behavioral: Negative for depression, hallucinations, memory loss, substance abuse and suicidal ideas. The patient is not nervous/anxious and does not have insomnia.     Blood pressure 128/74, pulse 80, height 6' 1.75" (1.873 m), weight 185 lb (83.9 kg).Body mass index is 23.91 kg/m.  General Appearance: Casual and Neat  Eye Contact:  Good  Speech:  Clear and Coherent and fast  Volume:  Normal  Mood:  Euthymic  Affect:  Appropriate and Congruent  Thought Process:  Coherent, Goal Directed, Linear and Descriptions of Associations: Intact  Orientation:  Full (Time, Place, and Person)  Thought Content: Logical and Hallucinations: None   Suicidal Thoughts:  No  Homicidal Thoughts:  No  Memory:  Immediate;   Good Recent;   Good Remote;   Good  Judgement:  Good  Insight:  Good  Psychomotor Activity:  Slight hyperactivity, sits with little fidgeting  Concentration:  Concentration: Good and Attention Span: Good  Recall:  Good  Fund of Knowledge: Good  Language: Good  Akathisia:  No  Handed:  Right  AIMS (if indicated): not done  Assets:  Communication Skills Desire for Improvement Financial Resources/Insurance Housing Intimacy Physical Health Resilience Social  Support Talents/Skills Transportation Vocational/Educational  ADL's:  Intact  Cognition: WNL  Sleep:  Good   Screenings: patient reports labs have been done in past year.  He will bring/fax for our records.  VSS, no cardiac concerns.   Assessment and Plan: Attention deficit disorder, inattentive type.  Patient doing well on his current Vyvanse 40 mg which he takes Monday to Friday, and occasionally on weekends.  He has no side effects.  He is not interested in counseling.  He will provide a copy of annual labs.  Discussed medication side effects and benefits especially stimulant abuse, tolerance and withdrawal.  Recommended to call us back if he has any question or any concern.  Prescriptions provided for #30 x 3.  Time spent 25 minutes.  More than 50% of the time spent in medication education, psychoeducation, counseling and coordination of care.  Discuss safety plan that anytime having active suicidal thoughts or homicidal thoughts then patient need to call 911 or go to the  local emergency room.    Follow-up in 3 months.   Mariel Craft, MD 02/12/2018, 8:55 AM

## 2018-05-14 ENCOUNTER — Ambulatory Visit (HOSPITAL_COMMUNITY): Payer: PRIVATE HEALTH INSURANCE | Admitting: Psychiatry

## 2018-06-19 ENCOUNTER — Other Ambulatory Visit (HOSPITAL_COMMUNITY): Payer: Self-pay

## 2018-06-19 DIAGNOSIS — Z79899 Other long term (current) drug therapy: Secondary | ICD-10-CM

## 2018-06-20 LAB — COMPREHENSIVE METABOLIC PANEL
ALT: 45 IU/L — ABNORMAL HIGH (ref 0–44)
AST: 29 IU/L (ref 0–40)
Albumin/Globulin Ratio: 1.6 (ref 1.2–2.2)
Albumin: 4.6 g/dL (ref 4.0–5.0)
Alkaline Phosphatase: 67 IU/L (ref 39–117)
BUN/Creatinine Ratio: 12 (ref 9–20)
BUN: 11 mg/dL (ref 6–20)
Bilirubin Total: 0.5 mg/dL (ref 0.0–1.2)
CHLORIDE: 96 mmol/L (ref 96–106)
CO2: 21 mmol/L (ref 20–29)
Calcium: 9.7 mg/dL (ref 8.7–10.2)
Creatinine, Ser: 0.91 mg/dL (ref 0.76–1.27)
GFR calc Af Amer: 127 mL/min/{1.73_m2} (ref >59)
GFR calc non Af Amer: 110 mL/min/{1.73_m2} (ref >59)
Globulin, Total: 2.9 g/dL (ref 1.5–4.5)
Glucose: 107 mg/dL — ABNORMAL HIGH (ref 65–99)
Potassium: 4.5 mmol/L (ref 3.5–5.2)
Sodium: 136 mmol/L (ref 134–144)
Total Protein: 7.5 g/dL (ref 6.0–8.5)

## 2018-06-20 LAB — HEMOGLOBIN A1C
Est. average glucose Bld gHb Est-mCnc: 105 mg/dL
Hgb A1c MFr Bld: 5.3 % (ref 4.8–5.6)

## 2018-06-20 LAB — CBC WITH DIFFERENTIAL/PLATELET
Basophils Absolute: 0.1 10*3/uL (ref 0.0–0.2)
Basos: 1 %
EOS (ABSOLUTE): 0.1 10*3/uL (ref 0.0–0.4)
Eos: 1 %
Hematocrit: 47.8 % (ref 37.5–51.0)
Hemoglobin: 16.5 g/dL (ref 13.0–17.7)
Immature Grans (Abs): 0.1 10*3/uL (ref 0.0–0.1)
Immature Granulocytes: 1 %
Lymphocytes Absolute: 2.3 10*3/uL (ref 0.7–3.1)
Lymphs: 27 %
MCH: 31.9 pg (ref 26.6–33.0)
MCHC: 34.5 g/dL (ref 31.5–35.7)
MCV: 92 fL (ref 79–97)
MONOS ABS: 0.6 10*3/uL (ref 0.1–0.9)
Monocytes: 7 %
NEUTROS PCT: 63 %
Neutrophils Absolute: 5.4 10*3/uL (ref 1.4–7.0)
PLATELETS: 257 10*3/uL (ref 150–450)
RBC: 5.18 x10E6/uL (ref 4.14–5.80)
RDW: 12.6 % (ref 11.6–15.4)
WBC: 8.4 10*3/uL (ref 3.4–10.8)

## 2018-06-27 ENCOUNTER — Encounter (HOSPITAL_COMMUNITY): Payer: Self-pay | Admitting: Psychiatry

## 2018-06-27 ENCOUNTER — Ambulatory Visit (INDEPENDENT_AMBULATORY_CARE_PROVIDER_SITE_OTHER): Payer: PRIVATE HEALTH INSURANCE | Admitting: Psychiatry

## 2018-06-27 DIAGNOSIS — F900 Attention-deficit hyperactivity disorder, predominantly inattentive type: Secondary | ICD-10-CM | POA: Diagnosis not present

## 2018-06-27 MED ORDER — LISDEXAMFETAMINE DIMESYLATE 40 MG PO CAPS: 40.0000 mg | ORAL_CAPSULE | ORAL | 0 refills | Status: DC

## 2018-06-27 NOTE — Progress Notes (Signed)
BH MD/PA/NP OP Progress Note  06/27/2018 8:29 AM Gabriel FuLarry Klein  MRN:  [id number]  Chief Complaint: I am doing good.  I am engaged and now thinking about getting married this year.  HPI: Gabriel NajjarLarry came for his follow-up appointment.  He is doing well on his medication.  He takes Vyvanse Monday to Friday and on rare occasions on the weekend.  His job is going well.  He is excited because he got engaged and now planning to get married sometime this year.  His attention, concentration is good.  He has no side effects including insomnia, tremors, palpitation or lack of appetite.  He likes his job.  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  He denies drinking or using any illegal substances.  He denies any mania, irritability or any anger.  His energy level is good.  He denies drinking or using any illegal substances.  Visit Diagnosis:    ICD-10-CM   1. Attention deficit hyperactivity disorder (ADHD), predominantly inattentive type F90.0 lisdexamfetamine (VYVANSE) 40 MG capsule    Past Psychiatric History: Reviewed. Patient has ADD since school age. Tried Ritalin, Strattera, Adderall but these medicine stopped working after a while. No H/O psychiatric inpatient treatment, mania, psychosis, hallucination.  Past Medical History:  Past Medical History:  Diagnosis Date  . ADHD (attention deficit hyperactivity disorder)     Past Surgical History:  Procedure Laterality Date  . h/o arm surgery      Family Psychiatric History: Reviewed.  Family History: History reviewed. No pertinent family history.  Social History:  Social History   Socioeconomic History  . Marital status: Single    Spouse name: Not on file  . Number of children: Not on file  . Years of education: Not on file  . Highest education level: Not on file  Occupational History  . Not on file  Social Needs  . Financial resource strain: Not hard at all  . Food insecurity:    Worry: Never true    Inability: Never  true  . Transportation needs:    Medical: No    Non-medical: No  Tobacco Use  . Smoking status: Never Smoker  . Smokeless tobacco: Never Used  Substance and Sexual Activity  . Alcohol use: Yes    Alcohol/week: 0.0 standard drinks    Comment: Occasional use  . Drug use: No  . Sexual activity: Yes    Partners: Female    Birth control/protection: None  Lifestyle  . Physical activity:    Days per week: 3 days    Minutes per session: 50 min  . Stress: Only a little  Relationships  . Social connections:    Talks on phone: More than three times a week    Gets together: Once a week    Attends religious service: Never    Active member of club or organization: No    Attends meetings of clubs or organizations: Never    Relationship status: Never married  Other Topics Concern  . Not on file  Social History Narrative  . Not on file    Allergies: No Known Allergies  Metabolic Disorder Labs: Recent Results (from the past 2160 hour(s))  Hemoglobin A1c     Status: None   Collection Time: 06/19/18  2:01 PM  Result Value Ref Range   Hgb A1c MFr Bld 5.3 4.8 - 5.6 %    Comment:          Prediabetes: 5.7 - 6.4  Diabetes: >6.4          Glycemic control for adults with diabetes: <7.0    Est. average glucose Bld gHb Est-mCnc 105 mg/dL  Comprehensive metabolic panel     Status: Abnormal   Collection Time: 06/19/18  2:01 PM  Result Value Ref Range   Glucose 107 (H) 65 - 99 mg/dL   BUN 11 6 - 20 mg/dL   Creatinine, Ser 8.56 0.76 - 1.27 mg/dL   GFR calc non Af Amer 110 >59 mL/min/1.73   GFR calc Af Amer 127 >59 mL/min/1.73   BUN/Creatinine Ratio 12 9 - 20   Sodium 136 134 - 144 mmol/L   Potassium 4.5 3.5 - 5.2 mmol/L   Chloride 96 96 - 106 mmol/L   CO2 21 20 - 29 mmol/L   Calcium 9.7 8.7 - 10.2 mg/dL   Total Protein 7.5 6.0 - 8.5 g/dL   Albumin 4.6 4.0 - 5.0 g/dL    Comment:               **Please note reference interval change**   Globulin, Total 2.9 1.5 - 4.5 g/dL    Albumin/Globulin Ratio 1.6 1.2 - 2.2   Bilirubin Total 0.5 0.0 - 1.2 mg/dL   Alkaline Phosphatase 67 39 - 117 IU/L   AST 29 0 - 40 IU/L   ALT 45 (H) 0 - 44 IU/L  CBC with Differential     Status: None   Collection Time: 06/19/18  2:01 PM  Result Value Ref Range   WBC 8.4 3.4 - 10.8 x10E3/uL   RBC 5.18 4.14 - 5.80 x10E6/uL   Hemoglobin 16.5 13.0 - 17.7 g/dL   Hematocrit 31.4 97.0 - 51.0 %   MCV 92 79 - 97 fL   MCH 31.9 26.6 - 33.0 pg   MCHC 34.5 31.5 - 35.7 g/dL   RDW 26.3 78.5 - 88.5 %   Platelets 257 150 - 450 x10E3/uL   Neutrophils 63 Not Estab. %   Lymphs 27 Not Estab. %   Monocytes 7 Not Estab. %   Eos 1 Not Estab. %   Basos 1 Not Estab. %   Neutrophils Absolute 5.4 1.4 - 7.0 x10E3/uL   Lymphocytes Absolute 2.3 0.7 - 3.1 x10E3/uL   Monocytes Absolute 0.6 0.1 - 0.9 x10E3/uL   EOS (ABSOLUTE) 0.1 0.0 - 0.4 x10E3/uL   Basophils Absolute 0.1 0.0 - 0.2 x10E3/uL   Immature Granulocytes 1 Not Estab. %   Immature Grans (Abs) 0.1 0.0 - 0.1 x10E3/uL   Lab Results  Component Value Date   HGBA1C 5.3 06/19/2018   MPG 108 12/08/2014   MPG 111 08/13/2013   No results found for: PROLACTIN No results found for: CHOL, TRIG, HDL, CHOLHDL, VLDL, LDLCALC Lab Results  Component Value Date   TSH 1.250 12/08/2014    Therapeutic Level Labs: No results found for: LITHIUM No results found for: VALPROATE No components found for:  CBMZ  Current Medications: Current Outpatient Medications  Medication Sig Dispense Refill  . lisdexamfetamine (VYVANSE) 40 MG capsule Take 1 capsule (40 mg total) by mouth every morning. 30 capsule 0  . lisdexamfetamine (VYVANSE) 40 MG capsule Take 1 capsule (40 mg total) by mouth every morning. 30 capsule 0  . VYVANSE 40 MG capsule Take 1 capsule (40 mg total) by mouth every morning. 30 capsule 0   No current facility-administered medications for this visit.      Musculoskeletal: Strength & Muscle Tone: within normal limits Gait & Station:  normal Patient leans: N/A  Psychiatric Specialty Exam: ROS  Blood pressure 126/72, height 6' 1.75" (1.873 m), weight 190 lb (86.2 kg).Body mass index is 24.56 kg/m.  General Appearance: Casual  Eye Contact:  Good  Speech:  Clear and Coherent  Volume:  Normal  Mood:  Euthymic  Affect:  Appropriate  Thought Process:  Goal Directed  Orientation:  Full (Time, Place, and Person)  Thought Content: Logical   Suicidal Thoughts:  No  Homicidal Thoughts:  No  Memory:  Immediate;   Good Recent;   Good Remote;   Good  Judgement:  Good  Insight:  Good  Psychomotor Activity:  Normal  Concentration:  Concentration: Good and Attention Span: Good  Recall:  Good  Fund of Knowledge: Good  Language: Good  Akathisia:  No  Handed:  Right  AIMS (if indicated): not done  Assets:  Communication Skills Desire for Improvement Intimacy Physical Health Resilience Social Support Talents/Skills Transportation  ADL's:  Intact  Cognition: WNL  Sleep:  Good   Screenings:   Assessment and Plan: Attention deficit disorder, inattentive type.  Patient is a stable on his current dose of Vyvanse 40 mg which he takes Monday to Friday.  Patient has no side effects.  He has no concerns.  He is not interested in therapy.  Recently had blood work which is normal.  I reviewed the blood work results with him.  We will continue Vyvanse 40 mg daily.  Recommended to call us back if is any question or any concern.  Follow-up in 4 months.   Cleotis Nipper, MD 06/27/2018, 8:29 AM

## 2018-08-12 ENCOUNTER — Telehealth (HOSPITAL_COMMUNITY): Payer: Self-pay

## 2018-08-12 DIAGNOSIS — F900 Attention-deficit hyperactivity disorder, predominantly inattentive type: Secondary | ICD-10-CM

## 2018-08-12 NOTE — Telephone Encounter (Signed)
Medication management - telephone message from patient requesting a new Vyvanse order, last provided 06/27/18 and patient does not return until  10/21/2018.

## 2018-08-13 MED ORDER — LISDEXAMFETAMINE DIMESYLATE 40 MG PO CAPS: 40.0000 mg | ORAL_CAPSULE | ORAL | 0 refills | Status: DC

## 2018-08-13 NOTE — Telephone Encounter (Signed)
Medication managemement - Message left for patient Dr. Lolly Mustache had sent in his requested medication refill order and to call back if any questions.

## 2018-08-13 NOTE — Telephone Encounter (Signed)
Prescription sent to the pharmacy.

## 2018-09-30 ENCOUNTER — Telehealth (HOSPITAL_COMMUNITY): Payer: Self-pay

## 2018-09-30 DIAGNOSIS — F900 Attention-deficit hyperactivity disorder, predominantly inattentive type: Secondary | ICD-10-CM

## 2018-09-30 MED ORDER — LISDEXAMFETAMINE DIMESYLATE 40 MG PO CAPS: 40.0000 mg | ORAL_CAPSULE | ORAL | 0 refills | Status: DC

## 2018-09-30 NOTE — Telephone Encounter (Signed)
done

## 2018-09-30 NOTE — Telephone Encounter (Signed)
Patient has an appointment later this month, he needs a refill on V[facility]vanse - [facility] [address]

## 2018-10-21 ENCOUNTER — Ambulatory Visit (INDEPENDENT_AMBULATORY_CARE_PROVIDER_SITE_OTHER): Payer: PRIVATE HEALTH INSURANCE | Admitting: Psychiatry

## 2018-10-21 ENCOUNTER — Other Ambulatory Visit: Payer: Self-pay

## 2018-10-21 ENCOUNTER — Encounter (HOSPITAL_COMMUNITY): Payer: Self-pay | Admitting: Psychiatry

## 2018-10-21 DIAGNOSIS — F900 Attention-deficit hyperactivity disorder, predominantly inattentive type: Secondary | ICD-10-CM | POA: Diagnosis not present

## 2018-10-21 MED ORDER — LISDEXAMFETAMINE DIMESYLATE 40 MG PO CAPS: 40.0000 mg | ORAL_CAPSULE | ORAL | 0 refills | Status: DC

## 2018-10-21 NOTE — Progress Notes (Signed)
Virtual Visit via Telephone Note  I connected with Gabriel Klein on 10/21/18 at  8:40 AM EDT by telephone and verified that I am speaking with the correct person using two identifiers.   I discussed the limitations, risks, security and privacy concerns of performing an evaluation and management service by telephone and the availability of in person appointments. I also discussed with the patient that there may be a patient responsible charge related to this service. The patient expressed understanding and agreed to proceed.   History of Present Illness: Patient evaluated by phone session.  He is taking Vyvanse Monday to Friday which is working very well for him.  His attention, concentration is good.  He is able to do multitasking.  Lately due to COVID-19 is doing mostly telehealth visits.  He admitted in the beginning it was not busy but now he is quite busy in the office.  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  His relationship is going well.  He is sad that he is not sure about the wedding plans due to COVID-19 this year.  Overall he described his mood is good.  He denies any irritability, insomnia, tremors, shakes or any EPS.  He reported his appetite is good and his weight is stable.  He usually go for hiking on the weekends.  He denies drinking or using any illegal substances.  He like to continue his Vyvanse 40 mg daily.  He denies drinking or using any illegal substances.   Past Psychiatric History: Reviewed. Patient has ADD since school age. Tried Ritalin, Strattera, Adderall but these medicine stopped working after a while. No H/O psychiatric inpatient treatment, mania, psychosis, hallucination.   Psychiatric Specialty Exam: Physical Exam  ROS  There were no vitals taken for this visit.There is no height or weight on file to calculate BMI.  General Appearance: NA  Eye Contact:  NA  Speech:  Clear and Coherent and Normal Rate  Volume:  Normal  Mood:  Euthymic  Affect:  NA  Thought  Process:  Goal Directed  Orientation:  Full (Time, Place, and Person)  Thought Content:  Logical  Suicidal Thoughts:  No  Homicidal Thoughts:  No  Memory:  Immediate;   Good Recent;   Good Remote;   Good  Judgement:  Good  Insight:  Good  Psychomotor Activity:  NA  Concentration:  Concentration: Good and Attention Span: Good  Recall:  Good  Fund of Knowledge:  Good  Language:  Good  Akathisia:  NA  Handed:  Right  AIMS (if indicated):     Assets:  Communication Skills Desire for Improvement Housing Resilience Social Support Talents/Skills Transportation  ADL's:  Intact  Cognition:  WNL  Sleep:   good      Assessment and Plan: Attention deficit disorder, inattentive type.  Patient is a stable on his current medication.  He is taking Vyvanse 40 mg Monday to Friday.  He reported no side effects.  Discussed medication side effects and benefits.  Continue Vyvanse 40 mg daily.  Discussed stimulant abuse, tolerance and withdrawal.  Recommended to call us back if is any question or any concern.  Follow-up in 4 months.  Follow Up Instructions:    I discussed the assessment and treatment plan with the patient. The patient was provided an opportunity to ask questions and all were answered. The patient agreed with the plan and demonstrated an understanding of the instructions.   The patient was advised to call back or seek an in-person evaluation if the  symptoms worsen or if the condition fails to improve as anticipated.  I provided 15 minutes of non-face-to-face time during this encounter.   Kathlee Nations, MD

## 2018-12-30 ENCOUNTER — Telehealth (HOSPITAL_COMMUNITY): Payer: Self-pay

## 2018-12-30 DIAGNOSIS — F900 Attention-deficit hyperactivity disorder, predominantly inattentive type: Secondary | ICD-10-CM

## 2018-12-30 MED ORDER — LISDEXAMFETAMINE DIMESYLATE 40 MG PO CAPS: 40.0000 mg | ORAL_CAPSULE | ORAL | 0 refills | Status: DC

## 2018-12-30 NOTE — Telephone Encounter (Signed)
done

## 2018-12-30 NOTE — Telephone Encounter (Signed)
Patient is calling for a refill on Vyvanse. [facility] [address]

## 2018-12-31 ENCOUNTER — Telehealth (HOSPITAL_COMMUNITY): Payer: Self-pay

## 2018-12-31 NOTE — Telephone Encounter (Signed)
[employer] APPROVED VYVANSE 40MG  PA # [id number] EFFECTIVE 12/31/18 TO 12/31/19

## 2019-01-21 ENCOUNTER — Ambulatory Visit (HOSPITAL_COMMUNITY): Payer: PRIVATE HEALTH INSURANCE | Admitting: Psychiatry

## 2019-02-10 ENCOUNTER — Telehealth (HOSPITAL_COMMUNITY): Payer: Self-pay

## 2019-02-10 DIAGNOSIS — F900 Attention-deficit hyperactivity disorder, predominantly inattentive type: Secondary | ICD-10-CM

## 2019-02-10 MED ORDER — LISDEXAMFETAMINE DIMESYLATE 40 MG PO CAPS: 40.0000 mg | ORAL_CAPSULE | ORAL | 0 refills | Status: DC

## 2019-02-10 NOTE — Telephone Encounter (Signed)
done

## 2019-02-10 NOTE — Telephone Encounter (Signed)
Patient is calling for a refill on Vyvanse. [facility]

## 2019-02-18 ENCOUNTER — Encounter (HOSPITAL_COMMUNITY): Payer: Self-pay | Admitting: Psychiatry

## 2019-02-18 ENCOUNTER — Ambulatory Visit (INDEPENDENT_AMBULATORY_CARE_PROVIDER_SITE_OTHER): Payer: PRIVATE HEALTH INSURANCE | Admitting: Psychiatry

## 2019-02-18 ENCOUNTER — Other Ambulatory Visit: Payer: Self-pay

## 2019-02-18 DIAGNOSIS — F900 Attention-deficit hyperactivity disorder, predominantly inattentive type: Secondary | ICD-10-CM

## 2019-02-18 MED ORDER — LISDEXAMFETAMINE DIMESYLATE 40 MG PO CAPS: 40.0000 mg | ORAL_CAPSULE | ORAL | 0 refills | Status: DC

## 2019-02-18 NOTE — Progress Notes (Signed)
Virtual Visit via Telephone Note  I connected with Gabriel Klein on 02/18/19 at 11:20 AM EDT by telephone and verified that I am speaking with the correct person using two identifiers.   I discussed the limitations, risks, security and privacy concerns of performing an evaluation and management service by telephone and the availability of in person appointments. I also discussed with the patient that there may be a patient responsible charge related to this service. The patient expressed understanding and agreed to proceed.   History of Present Illness: Patient was evaluated by phone session.  He is taking Vyvanse Monday to Friday when he is working.  He reported his attention, focus and multitasking is good.  He has no tremors, shakes or any EPS.  He has been very busy in the office.  In the beginning he was doing virtual and telehealth visits but now he is going to work every day.  His relationship with the fianc is also going very well.  He is not sure when he is going to get married but they are living together.  He had lost 5 pounds since he started running and continue to hiking on the weekends.  Denies drinking or using any illegal substances.  He is sleeping good.  Generally level is good.  He wants to continue his current medication.  Past Psychiatric History:Reviewed. Patient has ADD since school age. TriedRitalin, Strattera, Adderall but these medicine stopped working after a while. No H/Opsychiatric inpatient treatment, mania, psychosis, hallucination.   Psychiatric Specialty Exam: Physical Exam  ROS  There were no vitals taken for this visit.There is no height or weight on file to calculate BMI.  General Appearance: NA  Eye Contact:  NA  Speech:  Clear and Coherent and Normal Rate  Volume:  Normal  Mood:  Euthymic  Affect:  NA  Thought Process:  Coherent and Goal Directed  Orientation:  Full (Time, Place, and Person)  Thought Content:  WDL and Logical  Suicidal Thoughts:   No  Homicidal Thoughts:  No  Memory:  Immediate;   Good Recent;   Good Remote;   Good  Judgement:  Good  Insight:  Good  Psychomotor Activity:  NA  Concentration:  Concentration: Good and Attention Span: Good  Recall:  Good  Fund of Knowledge:  Good  Language:  Good  Akathisia:  No  Handed:  Right  AIMS (if indicated):     Assets:  Communication Skills Desire for [place] Talents/Skills Transportation  ADL's:  Intact  Cognition:  WNL  Sleep:         Assessment and Plan: Attention deficit disorder, inattentive type.  Patient is a stable on his current medication.  He is taking Vyvanse Monday to Friday.  He recently picked up the medication last week however he will need another prescription refill his next appointment in 4 months.  Discussed medication side effects and benefits.  Recommended to call us back if he has any question or any concern.  Follow-up in 4 months.  Follow Up Instructions:    I discussed the assessment and treatment plan with the patient. The patient was provided an opportunity to ask questions and all were answered. The patient agreed with the plan and demonstrated an understanding of the instructions.   The patient was advised to call back or seek an in-person evaluation if the symptoms worsen or if the condition fails to improve as anticipated.  I provided 15 minutes of non-face-to-face time during  this encounter.   Kathlee Nations, MD

## 2019-02-19 ENCOUNTER — Ambulatory Visit (HOSPITAL_COMMUNITY): Payer: PRIVATE HEALTH INSURANCE | Admitting: Psychiatry

## 2019-03-26 ENCOUNTER — Telehealth (HOSPITAL_COMMUNITY): Payer: Self-pay

## 2019-03-26 DIAGNOSIS — F900 Attention-deficit hyperactivity disorder, predominantly inattentive type: Secondary | ICD-10-CM

## 2019-03-26 NOTE — Telephone Encounter (Signed)
Patient called requesting a refill on his Vyvanse 40mg  [facility] [address] Thank you.

## 2019-03-30 MED ORDER — LISDEXAMFETAMINE DIMESYLATE 40 MG PO CAPS: 40.0000 mg | ORAL_CAPSULE | ORAL | 0 refills | Status: DC

## 2019-03-30 NOTE — Telephone Encounter (Signed)
He is more than 10 days early requesting his Vyvanse.  I have called in but pharmacy will give the medication on November 15.

## 2019-03-30 NOTE — Telephone Encounter (Signed)
Notified patient and relayed message.

## 2019-06-15 ENCOUNTER — Encounter (HOSPITAL_COMMUNITY): Payer: Self-pay | Admitting: Psychiatry

## 2019-06-15 ENCOUNTER — Other Ambulatory Visit: Payer: Self-pay

## 2019-06-15 ENCOUNTER — Ambulatory Visit (INDEPENDENT_AMBULATORY_CARE_PROVIDER_SITE_OTHER): Payer: PRIVATE HEALTH INSURANCE | Admitting: Psychiatry

## 2019-06-15 DIAGNOSIS — F900 Attention-deficit hyperactivity disorder, predominantly inattentive type: Secondary | ICD-10-CM

## 2019-06-15 MED ORDER — LISDEXAMFETAMINE DIMESYLATE 40 MG PO CAPS: 40.0000 mg | ORAL_CAPSULE | ORAL | 0 refills | Status: DC

## 2019-06-15 NOTE — Progress Notes (Signed)
Virtual Visit via Telephone Note  I connected with Gabriel Klein on 06/15/19 at 11:00 AM EST by telephone and verified that I am speaking with the correct person using two identifiers.   I discussed the limitations, risks, security and privacy concerns of performing an evaluation and management service by telephone and the availability of in person appointments. I also discussed with the patient that there may be a patient responsible charge related to this service. The patient expressed understanding and agreed to proceed.   History of Present Illness: Patient was evaluated by phone session.  He is taking Vyvanse Monday to Friday when he is working.  He reported things are going very well.  He had a good Christmas and he was able to do zoom meetings with relatives on the holidays.  His relationship is also going well.  He is more active and he had a good energy level.  He has no tremors, shakes or any EPS.  His attention focus is good.  He is able to do multitasking.  His job is going very well.  Denies drinking or using any illegal substances.  He wants to continue his current medication.  Past Psychiatric History:Reviewed. H/O ADD since school age. TriedRitalin, Strattera, Adderall but these medicine stopped working after a while. No H/Opsychiatric inpatient treatment, mania, psychosis, hallucination.    Psychiatric Specialty Exam: Physical Exam  Review of Systems  There were no vitals taken for this visit.There is no height or weight on file to calculate BMI.  General Appearance: NA  Eye Contact:  NA  Speech:  Clear and Coherent and Normal Rate  Volume:  Normal  Mood:  Euthymic  Affect:  NA  Thought Process:  Goal Directed  Orientation:  Full (Time, Place, and Person)  Thought Content:  WDL  Suicidal Thoughts:  No  Homicidal Thoughts:  No  Memory:  Immediate;   Good Recent;   Good Remote;   Good  Judgement:  Good  Insight:  Good  Psychomotor Activity:  NA  Concentration:   Concentration: Good and Attention Span: Good  Recall:  Good  Fund of Knowledge:  Good  Language:  Good  Akathisia:  No  Handed:  Right  AIMS (if indicated):     Assets:  Communication Skills Desire for Improvement Housing Resilience Social Support Talents/Skills Transportation  ADL's:  Intact  Cognition:  WNL  Sleep:   good      Assessment and Plan: Attention deficit disorder, inattentive type.  Patient is a stable on Vyvanse which he takes Monday to Friday when he goes to work.  He has no side effects.  Discussed medication side effect specially stimulant abuse, tolerance and withdrawal.  Patient does not ask for early refills.  Continue Vyvanse 40 mg daily.  Recommended to call us back if he has any question of any concern.  Follow-up in 4 months.  Follow Up Instructions:    I discussed the assessment and treatment plan with the patient. The patient was provided an opportunity to ask questions and all were answered. The patient agreed with the plan and demonstrated an understanding of the instructions.   The patient was advised to call back or seek an in-person evaluation if the symptoms worsen or if the condition fails to improve as anticipated.  I provided 20 minutes of non-face-to-face time during this encounter.   Cleotis Nipper, MD

## 2019-08-17 ENCOUNTER — Telehealth (HOSPITAL_COMMUNITY): Payer: Self-pay | Admitting: *Deleted

## 2019-08-17 DIAGNOSIS — F900 Attention-deficit hyperactivity disorder, predominantly inattentive type: Secondary | ICD-10-CM

## 2019-08-17 NOTE — Telephone Encounter (Signed)
Received a request from pt for refill on VyVanse 40mg . [facility]. Please review. Pt has an appointment upcoming 10/13/19.

## 2019-08-18 MED ORDER — LISDEXAMFETAMINE DIMESYLATE 40 MG PO CAPS: 40.0000 mg | ORAL_CAPSULE | ORAL | 0 refills | Status: DC

## 2019-08-18 NOTE — Telephone Encounter (Signed)
Done

## 2019-09-24 ENCOUNTER — Telehealth (HOSPITAL_COMMUNITY): Payer: Self-pay

## 2019-09-24 DIAGNOSIS — F900 Attention-deficit hyperactivity disorder, predominantly inattentive type: Secondary | ICD-10-CM

## 2019-09-24 MED ORDER — LISDEXAMFETAMINE DIMESYLATE 40 MG PO CAPS: 40.0000 mg | ORAL_CAPSULE | ORAL | 0 refills | Status: DC

## 2019-09-24 NOTE — Telephone Encounter (Signed)
Pt called requesting refill of Vyvanse 40mg . Last filled 3/23. Next appointment [facility]/18[facility]

## 2019-09-24 NOTE — Telephone Encounter (Signed)
Done

## 2019-10-13 ENCOUNTER — Other Ambulatory Visit: Payer: Self-pay

## 2019-10-13 ENCOUNTER — Telehealth (INDEPENDENT_AMBULATORY_CARE_PROVIDER_SITE_OTHER): Payer: PRIVATE HEALTH INSURANCE | Admitting: Psychiatry

## 2019-10-13 ENCOUNTER — Encounter (HOSPITAL_COMMUNITY): Payer: Self-pay | Admitting: Psychiatry

## 2019-10-13 DIAGNOSIS — F900 Attention-deficit hyperactivity disorder, predominantly inattentive type: Secondary | ICD-10-CM

## 2019-10-13 MED ORDER — LISDEXAMFETAMINE DIMESYLATE 40 MG PO CAPS: 40.0000 mg | ORAL_CAPSULE | ORAL | 0 refills | Status: DC

## 2019-10-13 NOTE — Progress Notes (Signed)
Virtual Visit via Telephone Note  I connected with Gabriel Klein on 10/13/19 at 10:00 AM EDT by telephone and verified that I am speaking with the correct person using two identifiers.   I discussed the limitations, risks, security and privacy concerns of performing an evaluation and management service by telephone and the availability of in person appointments. I also discussed with the patient that there may be a patient responsible charge related to this service. The patient expressed understanding and agreed to proceed.   History of Present Illness: Patient is evaluated by phone session.  He is doing well on his current medication.  He is taking Vyvanse Monday through Friday and it is helping his focus, attention and concentration.  He is sleeping good.  He is getting married next month and is excited about it.  Patient told that they have decided to get married quickly and hopefully at the end of the year they will have a bigger celebration.  Patient reported his job is going well.  He continues to exercise few days a week.  He has no tremors, shakes or any EPS.  Denies drinking or using any illegal substances.  Past Psychiatric History:Reviewed. H/O ADD since school age. TriedRitalin, Strattera, Adderall but these medicine stopped working after a while. No H/Opsychiatric inpatient treatment, mania, psychosis, hallucination.  Psychiatric Specialty Exam: Physical Exam  Review of Systems  There were no vitals taken for this visit.There is no height or weight on file to calculate BMI.  General Appearance: NA  Eye Contact:  NA  Speech:  Clear and Coherent and Normal Rate  Volume:  Normal  Mood:  Euthymic  Affect:  NA  Thought Process:  Goal Directed  Orientation:  Full (Time, Place, and Person)  Thought Content:  WDL  Suicidal Thoughts:  No  Homicidal Thoughts:  No  Memory:  Immediate;   Good Recent;   Good Remote;   Good  Judgement:  Good  Insight:  Good  Psychomotor Activity:   NA  Concentration:  Concentration: Good and Attention Span: Good  Recall:  Good  Fund of Knowledge:  Good  Language:  Good  Akathisia:  No  Handed:  Right  AIMS (if indicated):     Assets:  Communication Skills Desire for Improvement Housing Resilience Social Support Talents/Skills Transportation  ADL's:  Intact  Cognition:  WNL  Sleep:   ok      Assessment and Plan: Attention deficit disorder, inattentive type.  Patient is a stable on Vyvanse 40 mg daily.  He takes Monday to Friday or when he goes to work.  Discussed medication side effects.  Recommended to call us back if he has any questions or any concerns.  Follow-up in 4 months.  Follow Up Instructions:    I discussed the assessment and treatment plan with the patient. The patient was provided an opportunity to ask questions and all were answered. The patient agreed with the plan and demonstrated an understanding of the instructions.   The patient was advised to call back or seek an in-person evaluation if the symptoms worsen or if the condition fails to improve as anticipated.  I provided 15 minutes of non-face-to-face time during this encounter.   Cleotis Nipper, MD

## 2019-12-25 ENCOUNTER — Telehealth (HOSPITAL_COMMUNITY): Payer: Self-pay | Admitting: *Deleted

## 2019-12-25 DIAGNOSIS — F900 Attention-deficit hyperactivity disorder, predominantly inattentive type: Secondary | ICD-10-CM

## 2019-12-25 MED ORDER — LISDEXAMFETAMINE DIMESYLATE 40 MG PO CAPS: 40.0000 mg | ORAL_CAPSULE | ORAL | 0 refills | Status: DC

## 2019-12-25 NOTE — Telephone Encounter (Signed)
done

## 2019-12-25 NOTE — Telephone Encounter (Signed)
Pt called requesting refill of VyVanse 40mg  last ordered on 10/13/19. Pt has an appointment on 02/08/20. Please review.

## 2020-02-08 ENCOUNTER — Encounter (HOSPITAL_COMMUNITY): Payer: Self-pay | Admitting: Psychiatry

## 2020-02-08 ENCOUNTER — Other Ambulatory Visit: Payer: Self-pay

## 2020-02-08 ENCOUNTER — Telehealth (INDEPENDENT_AMBULATORY_CARE_PROVIDER_SITE_OTHER): Payer: PRIVATE HEALTH INSURANCE | Admitting: Psychiatry

## 2020-02-08 DIAGNOSIS — F900 Attention-deficit hyperactivity disorder, predominantly inattentive type: Secondary | ICD-10-CM

## 2020-02-08 MED ORDER — LISDEXAMFETAMINE DIMESYLATE 40 MG PO CAPS: 40.0000 mg | ORAL_CAPSULE | ORAL | 0 refills | Status: DC

## 2020-02-08 NOTE — Progress Notes (Signed)
Virtual Visit via Video Note  I connected with Gabriel Klein on 02/08/20 at  8:20 AM EDT by a video enabled telemedicine application and verified that I am speaking with the correct person using two identifiers.  Location: Patient: home Provider: home office   I discussed the limitations of evaluation and management by telemedicine and the availability of in person appointments. The patient expressed understanding and agreed to proceed.  History of Present Illness: Patient is a weighted by phone session.  He is taking Vyvanse Monday to Friday which is helping his focus, attention and task and.  His job is going well.  He is working in person.  He denies any mania, psychosis, crying spells or any feeling of hopelessness.  His marriage life is going well.  Patient is disappointed that he cannot go anywhere for his honeymoon because of COVID but hoping once cases go down that he can plan to take vacation.  He is sleeping good.  He denies any tremors, shakes or any EPS.  His weight is stable.  He continues to exercise and camping which he likes a lot.  Denies drinking or using any illegal substances.  He like to keep his current medication.  Past Psychiatric History: H/OADD since school age. TriedRitalin, Strattera, Adderall but these medicine stopped working after a while. No H/Opsychiatric inpatient treatment, mania, psychosis, hallucination.   Psychiatric Specialty Exam: Physical Exam  Review of Systems  Weight 185 lb (83.9 kg).There is no height or weight on file to calculate BMI.  General Appearance: NA  Eye Contact:  NA  Speech:  Clear and Coherent and Normal Rate  Volume:  Normal  Mood:  Euthymic  Affect:  NA  Thought Process:  Goal Directed  Orientation:  Full (Time, Place, and Person)  Thought Content:  WDL  Suicidal Thoughts:  No  Homicidal Thoughts:  No  Memory:  Immediate;   Good Recent;   Good Remote;   Good  Judgement:  Good  Insight:  Present  Psychomotor  Activity:  NA  Concentration:  Concentration: Good and Attention Span: Good  Recall:  Good  Fund of Knowledge:  Good  Language:  Good  Akathisia:  No  Handed:  Right  AIMS (if indicated):     Assets:  Communication Skills Desire for Improvement Housing Resilience Social Support Talents/Skills Transportation  ADL's:  Intact  Cognition:  WNL  Sleep:   ok      Assessment and Plan: Attention deficit disorder, inattentive type.  Continue Vyvanse 40 mg which he takes Monday to Friday.  Discussed medication side effects and benefits.  Recommended to call us back if is any question or any concern.  Patient has no concerns about the medication.  Follow-up in 4 months.  Follow Up Instructions:    I discussed the assessment and treatment plan with the patient. The patient was provided an opportunity to ask questions and all were answered. The patient agreed with the plan and demonstrated an understanding of the instructions.   The patient was advised to call back or seek an in-person evaluation if the symptoms worsen or if the condition fails to improve as anticipated.  I provided 16 minutes of non-face-to-face time during this encounter.   Cleotis Nipper, MD

## 2020-02-11 ENCOUNTER — Telehealth (HOSPITAL_COMMUNITY): Payer: Self-pay | Admitting: *Deleted

## 2020-02-11 NOTE — Telephone Encounter (Signed)
PA for VyVanse 40mg  capsules initiated through CoverMyMeds Key #[id number].

## 2020-02-17 ENCOUNTER — Telehealth (HOSPITAL_COMMUNITY): Payer: Self-pay | Admitting: *Deleted

## 2020-02-17 NOTE — Telephone Encounter (Signed)
PA for VyVanse approved. PA# [id number], valid through 02/11/20.

## 2020-02-17 NOTE — Telephone Encounter (Signed)
Correction: PA for VyVanse approved through 02/10/21.

## 2020-03-28 ENCOUNTER — Telehealth (HOSPITAL_COMMUNITY): Payer: Self-pay | Admitting: *Deleted

## 2020-03-28 DIAGNOSIS — F900 Attention-deficit hyperactivity disorder, predominantly inattentive type: Secondary | ICD-10-CM

## 2020-03-28 NOTE — Telephone Encounter (Signed)
Pt called requesting a refill of the VyVanse. Pt has an upcoming appointment on 06/06/20.

## 2020-03-29 MED ORDER — LISDEXAMFETAMINE DIMESYLATE 40 MG PO CAPS: 40.0000 mg | ORAL_CAPSULE | ORAL | 0 refills | Status: DC

## 2020-03-29 NOTE — Telephone Encounter (Signed)
Done

## 2020-05-11 ENCOUNTER — Telehealth (HOSPITAL_COMMUNITY): Payer: Self-pay | Admitting: *Deleted

## 2020-05-11 DIAGNOSIS — F900 Attention-deficit hyperactivity disorder, predominantly inattentive type: Secondary | ICD-10-CM

## 2020-05-11 NOTE — Telephone Encounter (Signed)
Pt called requesting refill of the VyVanse. Pt has an upcoming appointment on 06/06/20.

## 2020-05-12 MED ORDER — LISDEXAMFETAMINE DIMESYLATE 40 MG PO CAPS: 40.0000 mg | ORAL_CAPSULE | ORAL | 0 refills | Status: DC

## 2020-05-12 NOTE — Telephone Encounter (Signed)
Done

## 2020-06-06 ENCOUNTER — Telehealth (INDEPENDENT_AMBULATORY_CARE_PROVIDER_SITE_OTHER): Payer: PRIVATE HEALTH INSURANCE | Admitting: Psychiatry

## 2020-06-06 ENCOUNTER — Encounter (HOSPITAL_COMMUNITY): Payer: Self-pay | Admitting: Psychiatry

## 2020-06-06 ENCOUNTER — Other Ambulatory Visit: Payer: Self-pay

## 2020-06-06 VITALS — Wt 187.0 lb

## 2020-06-06 DIAGNOSIS — F900 Attention-deficit hyperactivity disorder, predominantly inattentive type: Secondary | ICD-10-CM | POA: Diagnosis not present

## 2020-06-06 MED ORDER — LISDEXAMFETAMINE DIMESYLATE 40 MG PO CAPS: 40.0000 mg | ORAL_CAPSULE | ORAL | 0 refills | Status: DC

## 2020-06-06 NOTE — Progress Notes (Signed)
Virtual Visit via Telephone Note  I connected with Gabriel Klein on 06/06/20 at  8:20 AM EST by telephone and verified that I am speaking with the correct person using two identifiers.  Location: Patient: In Car Provider: Home Office   I discussed the limitations, risks, security and privacy concerns of performing an evaluation and management service by telephone and the availability of in person appointments. I also discussed with the patient that there may be a patient responsible charge related to this service. The patient expressed understanding and agreed to proceed.   History of Present Illness: Patient is evaluated by phone session.  He is on the phone by himself.  He has been doing well on his current medication.  His attention, concentration is good.  He is able to do multitasking.  His job is going well.  He is working in person.  He denies any mania, insomnia, psychosis, crying spells or any feeling of hopelessness.  He has no tremors shakes or any EPS.  He takes the medication Monday to Friday.  His marriage life is also going well.  He stayed at home but did go to in-laws.  He does not want to change the medication.  He continues to do exercise and his weight is stable.  His appetite is okay.  He denies drinking or using any illegal substances.  Past Psychiatric History: H/OADD since school age. TriedRitalin, Strattera, Adderall but these medicine stopped working after a while. No H/Opsychiatric inpatient treatment, mania, psychosis, hallucination.   Psychiatric Specialty Exam: Physical Exam  Review of Systems  Weight 187 lb (84.8 kg).Body mass index is 24.17 kg/m.  General Appearance: NA  Eye Contact:  NA  Speech:  Clear and Coherent and Normal Rate  Volume:  Normal  Mood:  Euthymic  Affect:  NA  Thought Process:  Goal Directed  Orientation:  Full (Time, Place, and Person)  Thought Content:  Logical  Suicidal Thoughts:  No  Homicidal Thoughts:  No  Memory:   Immediate;   Good Recent;   Good Remote;   Good  Judgement:  Good  Insight:  Present  Psychomotor Activity:  NA  Concentration:  Concentration: Good and Attention Span: Good  Recall:  Good  Fund of Knowledge:  Good  Language:  Good  Akathisia:  No  Handed:  Right  AIMS (if indicated):     Assets:  Architect Housing Resilience Social Support Talents/Skills Transportation Vocational/Educational  ADL's:  Intact  Cognition:  WNL  Sleep:   good      Assessment and Plan: Attention deficit disorder, inattentive type.  Continue Vyvanse which he takes Monday to Friday.  Discussed medication side effects and benefits.  Recommended to call us back if is any question or any concern.  Follow-up in 4 months.  Follow Up Instructions:    I discussed the assessment and treatment plan with the patient. The patient was provided an opportunity to ask questions and all were answered. The patient agreed with the plan and demonstrated an understanding of the instructions.   The patient was advised to call back or seek an in-person evaluation if the symptoms worsen or if the condition fails to improve as anticipated.  I provided 8 minutes of non-face-to-face time during this encounter.   Cleotis Nipper, MD

## 2020-08-12 ENCOUNTER — Telehealth (HOSPITAL_COMMUNITY): Payer: Self-pay | Admitting: *Deleted

## 2020-08-12 DIAGNOSIS — F900 Attention-deficit hyperactivity disorder, predominantly inattentive type: Secondary | ICD-10-CM

## 2020-08-12 MED ORDER — LISDEXAMFETAMINE DIMESYLATE 40 MG PO CAPS: 40.0000 mg | ORAL_CAPSULE | ORAL | 0 refills | Status: DC

## 2020-08-12 NOTE — Telephone Encounter (Signed)
Done

## 2020-08-12 NOTE — Telephone Encounter (Signed)
Pt called requesting refill of the VyVanse. Pt has an upcoming appointment on 10/03/20.

## 2020-09-27 ENCOUNTER — Telehealth (HOSPITAL_COMMUNITY): Payer: Self-pay | Admitting: *Deleted

## 2020-09-27 DIAGNOSIS — F900 Attention-deficit hyperactivity disorder, predominantly inattentive type: Secondary | ICD-10-CM

## 2020-09-27 MED ORDER — LISDEXAMFETAMINE DIMESYLATE 40 MG PO CAPS: 40.0000 mg | ORAL_CAPSULE | ORAL | 0 refills | Status: DC

## 2020-09-27 NOTE — Telephone Encounter (Signed)
Pt called requesting refill of VyVanse. Gabriel Klein has an upcoming appointment on 10/03/20. Thanks.

## 2020-09-27 NOTE — Telephone Encounter (Signed)
Done

## 2020-10-03 ENCOUNTER — Encounter (HOSPITAL_COMMUNITY): Payer: Self-pay | Admitting: Psychiatry

## 2020-10-03 ENCOUNTER — Other Ambulatory Visit: Payer: Self-pay

## 2020-10-03 ENCOUNTER — Telehealth (INDEPENDENT_AMBULATORY_CARE_PROVIDER_SITE_OTHER): Payer: PRIVATE HEALTH INSURANCE | Admitting: Psychiatry

## 2020-10-03 VITALS — Wt 185.0 lb

## 2020-10-03 DIAGNOSIS — F900 Attention-deficit hyperactivity disorder, predominantly inattentive type: Secondary | ICD-10-CM

## 2020-10-03 NOTE — Progress Notes (Signed)
Virtual Visit via Telephone Note  I connected with Gabriel Klein on 10/03/20 at  8:20 AM EDT by telephone and verified that I am speaking with the correct person using two identifiers.  Location: Patient: Home Provider: Home Office   I discussed the limitations, risks, security and privacy concerns of performing an evaluation and management service by telephone and the availability of in person appointments. I also discussed with the patient that there may be a patient responsible charge related to this service. The patient expressed understanding and agreed to proceed.   History of Present Illness: Patient is evaluated by phone session.  He has a day off and he is staying home.  He had a birthday 2 days ago.  He feels the current medicine is working and he denies any issues with attention, concentration or multitasking.  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  He admitted job is busy but manageable.  He does not have trouble finishing his task.  He takes the medication Monday to Friday and he usually do not take on the holidays unless he is working.  He has no issues with the medication.  He denies any tremors, shakes or any EPS.  He denies any insomnia, anxiety or any palpitation.  His appetite is okay.  His weight is stable.  His marriage is working very well.  He denies drinking or using any illegal substances.  He like to keep his current medication.  He is not taking any other medication.  Past Psychiatric History: H/OADD since school age. TriedRitalin, Strattera, Adderall but these medicine stopped working after a while. No H/Opsychiatric inpatient treatment, mania, psychosis, hallucination.  Psychiatric Specialty Exam: Physical Exam  Review of Systems  Weight 185 lb (83.9 kg).There is no height or weight on file to calculate BMI.  General Appearance: NA  Eye Contact:  NA  Speech:  Clear and Coherent and Normal Rate   Volume:  Normal  Mood:  Euthymic  Affect:  NA  Thought Process:  Goal Directed  Orientation:  Full (Time, Place, and Person)  Thought Content:  WDL and Logical  Suicidal Thoughts:  No  Homicidal Thoughts:  No  Memory:  Immediate;   Good Recent;   Good Remote;   Good  Judgement:  Good  Insight:  Good  Psychomotor Activity:  NA  Concentration:  Concentration: Good and Attention Span: Good  Recall:  Good  Fund of Knowledge:  Good  Language:  Good  Akathisia:  No  Handed:  Right  AIMS (if indicated):     Assets:  Communication Skills Desire for Improvement Housing Resilience Social Support Talents/Skills Transportation  ADL's:  Intact  Cognition:  WNL  Sleep:   ok      Assessment and Plan: Attention deficit disorder, inattentive type.  Patient is a stable on Vyvanse 40 mg which he takes Monday to Friday.  He has no concerns or side effects.  Discussed medication benefits and side effects.  Recommended to call us back if is any question or any concern.  Follow-up in 4 months.  Follow Up Instructions:    I discussed the assessment and treatment plan with the patient. The patient was provided an opportunity to ask questions and all were answered. The patient agreed with the plan and demonstrated an understanding of the instructions.   The patient was advised to call back or seek an in-person evaluation if the symptoms worsen or if the  condition fails to improve as anticipated.  I provided 8 minutes of non-face-to-face time during this encounter.   Cleotis Nipper, MD

## 2020-11-11 ENCOUNTER — Telehealth (HOSPITAL_COMMUNITY): Payer: Self-pay | Admitting: *Deleted

## 2020-11-11 DIAGNOSIS — F900 Attention-deficit hyperactivity disorder, predominantly inattentive type: Secondary | ICD-10-CM

## 2020-11-11 NOTE — Telephone Encounter (Signed)
Pt called requesting refill of VyVanse 40 mg last written 09/27/20. Pt has an appointment on the books for 02/03/21. Please review.

## 2020-11-14 MED ORDER — LISDEXAMFETAMINE DIMESYLATE 40 MG PO CAPS: 40.0000 mg | ORAL_CAPSULE | ORAL | 0 refills | Status: DC

## 2020-11-14 NOTE — Telephone Encounter (Signed)
Done

## 2020-12-27 ENCOUNTER — Telehealth (HOSPITAL_COMMUNITY): Payer: Self-pay | Admitting: *Deleted

## 2020-12-27 ENCOUNTER — Other Ambulatory Visit (HOSPITAL_COMMUNITY): Payer: Self-pay | Admitting: Psychiatry

## 2020-12-27 DIAGNOSIS — F900 Attention-deficit hyperactivity disorder, predominantly inattentive type: Secondary | ICD-10-CM

## 2020-12-27 MED ORDER — LISDEXAMFETAMINE DIMESYLATE 40 MG PO CAPS: 40.0000 mg | ORAL_CAPSULE | ORAL | 0 refills | Status: DC

## 2020-12-27 NOTE — Telephone Encounter (Signed)
Pt of Dr. Lolly Mustache calling for refill of VyVanse 40 mg last written 11/14/20. Pt has an appointment on 02/03/21. Thanks.

## 2020-12-27 NOTE — Telephone Encounter (Signed)
sent 

## 2021-02-03 ENCOUNTER — Other Ambulatory Visit: Payer: Self-pay

## 2021-02-03 ENCOUNTER — Encounter (HOSPITAL_COMMUNITY): Payer: Self-pay | Admitting: Psychiatry

## 2021-02-03 ENCOUNTER — Telehealth (INDEPENDENT_AMBULATORY_CARE_PROVIDER_SITE_OTHER): Payer: No Typology Code available for payment source | Admitting: Psychiatry

## 2021-02-03 DIAGNOSIS — F900 Attention-deficit hyperactivity disorder, predominantly inattentive type: Secondary | ICD-10-CM | POA: Diagnosis not present

## 2021-02-03 MED ORDER — LISDEXAMFETAMINE DIMESYLATE 40 MG PO CAPS: 40.0000 mg | ORAL_CAPSULE | ORAL | 0 refills | Status: DC

## 2021-02-03 NOTE — Progress Notes (Signed)
Virtual Visit via Telephone Note  I connected with Gabriel Klein on 02/03/21 at  8:20 AM EDT by telephone and verified that I am speaking with the correct person using two identifiers.  Location: Patient: Work Provider: Economist   I discussed the limitations, risks, security and privacy concerns of performing an evaluation and management service by telephone and the availability of in person appointments. I also discussed with the patient that there may be a patient responsible charge related to this service. The patient expressed understanding and agreed to proceed.   History of Present Illness: Patient is evaluated by phone session.  He is taking Vyvanse Monday to Friday which helped his attention, focus and multitasking.  His job is going very well and he is able to finish his task on time.  He has no tremors, shakes or any EPS.  Energy level is good.  In July he had a trip to [place] for 10 days with his wife and he had a good time.  Patient denies drinking or using any illegal substances.  His appetite is okay and his weight is stable.  He is sleeping much better since he follows sleep hygiene and does not want to be on a media or watch TV after certain time.  He continues to go to gym 3-4 times a day.  He denies any agitation, anger, mania.  He like to keep his current medication.  Past Psychiatric History:  H/O ADD since school age.  Tried Ritalin, Strattera, Adderall but these medicine stopped working after a while.  No H/O psychiatric inpatient treatment, mania, psychosis, hallucination.   Psychiatric Specialty Exam: Physical Exam  Review of Systems  Weight 186 lb (84.4 kg).There is no height or weight on file to calculate BMI.  General Appearance: NA  Eye Contact:  NA  Speech:  Clear and Coherent  Volume:  Normal  Mood:  Euthymic  Affect:  NA  Thought Process:  Goal Directed  Orientation:  Full (Time, Place, and Person)  Thought Content:  Logical  Suicidal Thoughts:  No   Homicidal Thoughts:  No  Memory:  Immediate;   Good Recent;   Good Remote;   Good  Judgement:  Good  Insight:  Present  Psychomotor Activity:  NA  Concentration:  Concentration: Good and Attention Span: Good  Recall:  Good  Fund of Knowledge:  Good  Language:  Good  Akathisia:  No  Handed:  Right  AIMS (if indicated):     Assets:  Communication Skills Desire for Improvement Housing Resilience Social Support Talents/Skills Transportation  ADL's:  Intact  Cognition:  WNL  Sleep:   good      Assessment and Plan: Attention deficit disorder, inattentive type.  Patient is stable on his current medication.  Continue Vyvanse 40 mg Monday to Friday.  Discussed medication side effects and benefits.  Recommended to call us back if there is any question of any concern.  Follow-up in 4 months.  Follow Up Instructions:    I discussed the assessment and treatment plan with the patient. The patient was provided an opportunity to ask questions and all were answered. The patient agreed with the plan and demonstrated an understanding of the instructions.   The patient was advised to call back or seek an in-person evaluation if the symptoms worsen or if the condition fails to improve as anticipated.  I provided 12 minutes of non-face-to-face time during this encounter.   Cleotis Nipper, MD

## 2021-03-28 ENCOUNTER — Telehealth (HOSPITAL_COMMUNITY): Payer: Self-pay | Admitting: *Deleted

## 2021-03-28 DIAGNOSIS — F900 Attention-deficit hyperactivity disorder, predominantly inattentive type: Secondary | ICD-10-CM

## 2021-03-28 MED ORDER — LISDEXAMFETAMINE DIMESYLATE 40 MG PO CAPS: 40.0000 mg | ORAL_CAPSULE | ORAL | 0 refills | Status: DC

## 2021-03-28 NOTE — Telephone Encounter (Signed)
Done

## 2021-03-28 NOTE — Telephone Encounter (Signed)
Pt called requesting refill of Vyvanse 40 mg. Pt has an appointment upcoming on 05/04/21. Please review.

## 2021-05-04 ENCOUNTER — Other Ambulatory Visit: Payer: Self-pay

## 2021-05-04 ENCOUNTER — Telehealth (HOSPITAL_BASED_OUTPATIENT_CLINIC_OR_DEPARTMENT_OTHER): Payer: No Typology Code available for payment source | Admitting: Psychiatry

## 2021-05-04 ENCOUNTER — Encounter (HOSPITAL_COMMUNITY): Payer: Self-pay | Admitting: Psychiatry

## 2021-05-04 DIAGNOSIS — F900 Attention-deficit hyperactivity disorder, predominantly inattentive type: Secondary | ICD-10-CM

## 2021-05-04 MED ORDER — LISDEXAMFETAMINE DIMESYLATE 40 MG PO CAPS: 40.0000 mg | ORAL_CAPSULE | ORAL | 0 refills | Status: DC

## 2021-05-04 NOTE — Progress Notes (Signed)
Virtual Visit via Telephone Note  I connected with Gabriel Klein on 05/04/21 at  8:20 AM EST by telephone and verified that I am speaking with the correct person using two identifiers.  Location: Patient: Work Provider: Economist   I discussed the limitations, risks, security and privacy concerns of performing an evaluation and management service by telephone and the availability of in person appointments. I also discussed with the patient that there may be a patient responsible charge related to this service. The patient expressed understanding and agreed to proceed.   History of Present Illness: Patient is evaluated by phone session.  He is doing good on Vyvanse which he takes workdays Monday to Friday.  He reported Vyvanse working very well and he is able to focus at work.  His attention concentration is good and he is able to finish his task on time.  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  He like his job.  His wife is also working for a Forensic scientist.  He is sleeping good.  He has no tremor or shakes or any EPS.  He denies drinking or using any illegal substances.  His appetite is okay and his weight is stable.  He had a good Christmas.  He continues to go to the gym 3-4 times a day.  Denies any crying spells, irritability, insomnia, panic attack or any depression.   Past Psychiatric History:  H/O ADD since school age.  Tried Ritalin, Strattera, Adderall but these medicine stopped working after a while.  No H/O psychiatric inpatient treatment, mania, psychosis, hallucination.  Psychiatric Specialty Exam: Physical Exam  Review of Systems  Weight 185 lb (83.9 kg).There is no height or weight on file to calculate BMI.  General Appearance: NA  Eye Contact:  NA  Speech:  Clear and Coherent and Normal Rate  Volume:  Normal  Mood:  Euthymic  Affect:  NA  Thought Process:  Goal Directed  Orientation:  Full (Time, Place, and  Person)  Thought Content:  WDL  Suicidal Thoughts:  No  Homicidal Thoughts:  No  Memory:  Immediate;   Good Recent;   Good Remote;   Good  Judgement:  Good  Insight:  Good  Psychomotor Activity:  NA  Concentration:  Concentration: Good and Attention Span: Good  Recall:  Good  Fund of Knowledge:  Good  Language:  Good  Akathisia:  No  Handed:  Right  AIMS (if indicated):     Assets:  Communication Skills Desire for Improvement Housing Resilience Social Support Talents/Skills Transportation  ADL's:  Intact  Cognition:  WNL  Sleep:   7 hrs      Assessment and Plan: Attention deficit disorder, inattentive type.  Patient doing well on his current medication.  Continue Vyvanse Monday to Friday.  Discussed medication side effects and benefits.  He does not ask for early refills for his stimulants.  Recommended to call us back if is any question or any concern.  Follow-up in 4 months.  Follow Up Instructions:    I discussed the assessment and treatment plan with the patient. The patient was provided an opportunity to ask questions and all were answered. The patient agreed with the plan and demonstrated an understanding of the instructions.   The patient was advised to call back or seek an in-person evaluation if the symptoms worsen or if the condition fails to improve as anticipated.  I provided 17 minutes of non-face-to-face  time during this encounter.   Kathlee Nations, MD

## 2021-06-27 ENCOUNTER — Telehealth (HOSPITAL_COMMUNITY): Payer: Self-pay

## 2021-06-27 DIAGNOSIS — F900 Attention-deficit hyperactivity disorder, predominantly inattentive type: Secondary | ICD-10-CM

## 2021-06-27 MED ORDER — LISDEXAMFETAMINE DIMESYLATE 40 MG PO CAPS: 40.0000 mg | ORAL_CAPSULE | ORAL | 0 refills | Status: DC

## 2021-06-27 NOTE — Telephone Encounter (Signed)
Done

## 2021-06-27 NOTE — Telephone Encounter (Signed)
Patient called requesting a refill on his Vyvanse 40mg  [facility] [address] [facility] [place]. Pt has a followup appointment scheduled for 4/10. Please review and advise. Thank you

## 2021-06-30 NOTE — Telephone Encounter (Signed)
Notfied Patient

## 2021-08-09 ENCOUNTER — Other Ambulatory Visit (HOSPITAL_COMMUNITY): Payer: Self-pay | Admitting: Psychiatry

## 2021-08-09 ENCOUNTER — Telehealth (HOSPITAL_COMMUNITY): Payer: Self-pay | Admitting: *Deleted

## 2021-08-09 DIAGNOSIS — F900 Attention-deficit hyperactivity disorder, predominantly inattentive type: Secondary | ICD-10-CM

## 2021-08-09 MED ORDER — LISDEXAMFETAMINE DIMESYLATE 40 MG PO CAPS: 40.0000 mg | ORAL_CAPSULE | ORAL | 0 refills | Status: DC

## 2021-08-09 NOTE — Telephone Encounter (Signed)
Pt of Dr. Lolly Mustache called requesting a refill of the Vyvanse 40 mg. Last written 06/27/21 #30. Pt has a scheduled appointment 09/04/21. Please review. Please send to pharmacy on profile.  ?

## 2021-08-09 NOTE — Telephone Encounter (Signed)
sent 

## 2021-09-04 ENCOUNTER — Encounter (HOSPITAL_COMMUNITY): Payer: Self-pay | Admitting: Psychiatry

## 2021-09-04 ENCOUNTER — Telehealth (HOSPITAL_BASED_OUTPATIENT_CLINIC_OR_DEPARTMENT_OTHER): Payer: No Typology Code available for payment source | Admitting: Psychiatry

## 2021-09-04 ENCOUNTER — Encounter (HOSPITAL_COMMUNITY): Payer: No Typology Code available for payment source | Admitting: Psychiatry

## 2021-09-04 DIAGNOSIS — F900 Attention-deficit hyperactivity disorder, predominantly inattentive type: Secondary | ICD-10-CM | POA: Diagnosis not present

## 2021-09-04 NOTE — Progress Notes (Signed)
Virtual Visit via Telephone Note ? ?I connected with Gabriel Klein on 09/04/21 at 10:00 AM EDT by telephone and verified that I am speaking with the correct person using two identifiers. ? ?Location: ?Patient: Work ?Provider: Home Office ?  ?I discussed the limitations, risks, security and privacy concerns of performing an evaluation and management service by telephone and the availability of in person appointments. I also discussed with the patient that there may be a patient responsible charge related to this service. The patient expressed understanding and agreed to proceed. ? ? ?History of Present Illness: ?Patient is evaluated by phone session.  He is taking his Vyvanse Monday to Friday.  He feels the medicine is working as able to finish his job on time.  He does not get distracted and his attention concentration is good.  Last week he was off and he went to [place] and had a good trip.  He like hiking.  He has no tremors or shakes or any EPS.  He like his job and working at [employer] u[facility].  His wife is working for a Forensic scientist.  His appetite is okay.  He is looking for a PCP and like to have a blood work done and physical since he has not done in 2 years.  He denies any irritability, anger, mania, psychosis.  He wants to keep the current medication which is working well for him.  He denies drinking or using any illegal substances. ? ?Past Psychiatric History:  ?H/O ADD since school age.  Tried Ritalin, Strattera, Adderall but these medicine stopped working after a while.  No H/O psychiatric inpatient treatment, mania, psychosis, hallucination. ?  ?Psychiatric Specialty Exam: ?Physical Exam  ?Review of Systems  ?Weight 189 lb (85.7 kg).There is no height or weight on file to calculate BMI.  ?General Appearance: NA  ?Eye Contact:  NA  ?Speech:  Normal Rate  ?Volume:  Normal  ?Mood:  Euthymic  ?Affect:  NA  ?Thought Process:  Goal Directed  ?Orientation:  Full (Time, Place, and Person)   ?Thought Content:  Logical  ?Suicidal Thoughts:  No  ?Homicidal Thoughts:  No  ?Memory:  Immediate;   Good ?Recent;   Good ?Remote;   Good  ?Judgement:  Good  ?Insight:  Present  ?Psychomotor Activity:  Normal  ?Concentration:  Concentration: Good and Attention Span: Good  ?Recall:  Good  ?Fund of Knowledge:  Good  ?Language:  Good  ?Akathisia:  No  ?Handed:  Right  ?AIMS (if indicated):     ?Assets:  Communication Skills ?Desire for Improvement ?Housing ?Resilience ?Social Support ?Talents/Skills ?Transportation  ?ADL's:  Intact  ?Cognition:  WNL  ?Sleep:   ok  ? ? ? ? ?Assessment and Plan: ?Attention deficit disorder, inattentive type. ? ?Patient is stable on his current medication.  Continue Vyvanse 40 mg to take Monday to Friday.  He has recently picked up the medicine and does not need a new prescription.  He will call us when he needs a new prescription.  He is also looking for a PCP to establish care for physical and blood work.  I recommended to call us back if is any question or any concern.  Follow-up in 4 months. ? ?Follow Up Instructions: ? ?  ?I discussed the assessment and treatment plan with the patient. The patient was provided an opportunity to ask questions and all were answered. The patient agreed with the plan and demonstrated an understanding of the instructions. ?  ?The patient was advised  to call back or seek an in-person evaluation if the symptoms worsen or if the condition fails to improve as anticipated. ? ?Collaboration of Care: Other provider involved in patient's care AEB notes are in the epic to review.   ? ?Patient/Guardian was advised Release of Information must be obtained prior to any record release in order to collaborate their care with an outside provider. Patient/Guardian was advised if they have not already done so to contact the registration department to sign all necessary forms in order for [place] to release information regarding their care.  ? ?Consent: Patient/Guardian gives  verbal consent for treatment and assignment of benefits for services provided during this visit. Patient/Guardian expressed understanding and agreed to proceed.   ? ?I provided 18 minutes of non-face-to-face time during this encounter. ? ? ?Cleotis Nipper, MD  ?

## 2021-09-06 NOTE — Progress Notes (Signed)
No show earlier but reschedule later same day.  ?

## 2021-09-21 ENCOUNTER — Telehealth (HOSPITAL_COMMUNITY): Payer: Self-pay

## 2021-09-21 DIAGNOSIS — F900 Attention-deficit hyperactivity disorder, predominantly inattentive type: Secondary | ICD-10-CM

## 2021-09-21 MED ORDER — LISDEXAMFETAMINE DIMESYLATE 40 MG PO CAPS: 40.0000 mg | ORAL_CAPSULE | ORAL | 0 refills | Status: DC

## 2021-09-21 NOTE — Telephone Encounter (Signed)
Patient called requesting a refill on his Vyvanse 40mg  [facility] [address] [facility] [place]. Please review and advise. Thank you ?

## 2021-09-21 NOTE — Telephone Encounter (Signed)
Done

## 2021-09-22 NOTE — Telephone Encounter (Signed)
NOTIFIED PATIENT °

## 2021-11-02 ENCOUNTER — Telehealth (HOSPITAL_COMMUNITY): Payer: Self-pay | Admitting: *Deleted

## 2021-11-02 DIAGNOSIS — F900 Attention-deficit hyperactivity disorder, predominantly inattentive type: Secondary | ICD-10-CM

## 2021-11-02 MED ORDER — LISDEXAMFETAMINE DIMESYLATE 40 MG PO CAPS: 40.0000 mg | ORAL_CAPSULE | ORAL | 0 refills | Status: DC

## 2021-11-02 NOTE — Telephone Encounter (Signed)
Pt called requesting refill of Vyvanse 40 mg. Pt has appointment scheduled for 01/08/22. Please review.

## 2021-11-02 NOTE — Telephone Encounter (Signed)
Done

## 2021-12-18 ENCOUNTER — Telehealth (HOSPITAL_COMMUNITY): Payer: Self-pay | Admitting: *Deleted

## 2021-12-18 DIAGNOSIS — F900 Attention-deficit hyperactivity disorder, predominantly inattentive type: Secondary | ICD-10-CM

## 2021-12-18 NOTE — Telephone Encounter (Signed)
Pt called requesting refill of Vyvanse 40 mg. Prescription last e-scribed on 11/02/21. Pt has an appointment on scheduled for 01/08/22. Please review.

## 2021-12-19 MED ORDER — LISDEXAMFETAMINE DIMESYLATE 40 MG PO CAPS: 40.0000 mg | ORAL_CAPSULE | ORAL | 0 refills | Status: DC

## 2021-12-19 NOTE — Telephone Encounter (Signed)
Done

## 2022-01-08 ENCOUNTER — Telehealth (HOSPITAL_BASED_OUTPATIENT_CLINIC_OR_DEPARTMENT_OTHER): Payer: No Typology Code available for payment source | Admitting: Psychiatry

## 2022-01-08 ENCOUNTER — Encounter (HOSPITAL_COMMUNITY): Payer: Self-pay | Admitting: Psychiatry

## 2022-01-08 DIAGNOSIS — F900 Attention-deficit hyperactivity disorder, predominantly inattentive type: Secondary | ICD-10-CM

## 2022-01-08 NOTE — Progress Notes (Signed)
Virtual Visit via Telephone Note  I connected with Gabriel Klein on 01/08/22 at  8:40 AM EDT by telephone and verified that I am speaking with the correct person using two identifiers.  Location: Patient: Work Provider: Economist   I discussed the limitations, risks, security and privacy concerns of performing an evaluation and management service by telephone and the availability of in person appointments. I also discussed with the patient that there may be a patient responsible charge related to this service. The patient expressed understanding and agreed to proceed.   History of Present Illness: Patient is evaluated by phone session.  He is at work.  He apologized missing earlier phone call.  He reported summer was good and he was able to take time out to go to the beach and other places with his wife.  His job is going well.  He is able to do multitasking and time management.  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  Attention and focus is good.  His energy level is good.  He recently bought a pelaton bike and lost 5 pounds.  He has no tremors, shakes or any EPS.  He is in the process of getting a PCP in fall for physical and drugs.  He does not want to change the medication as he takes Vyvanse only Monday to Friday when he works.  He denies drinking or using any illegal substances.  Past Psychiatric History:  H/O ADD since school age.  Tried Ritalin, Strattera, Adderall but these medicine stopped working after a while.  No H/O psychiatric inpatient treatment, mania, psychosis, hallucination.   Psychiatric Specialty Exam: Physical Exam  Review of Systems  Weight 185 lb (83.9 kg).There is no height or weight on file to calculate BMI.  General Appearance: NA  Eye Contact:  NA  Speech:  Clear and Coherent and Normal Rate  Volume:  Normal  Mood:  Euthymic  Affect:  NA  Thought Process:  Goal Directed  Orientation:  Full (Time,  Place, and Person)  Thought Content:  Logical  Suicidal Thoughts:  No  Homicidal Thoughts:  No  Memory:  Immediate;   Good Recent;   Good Remote;   Good  Judgement:  Intact  Insight:  Present  Psychomotor Activity:  NA  Concentration:  Concentration: Good and Attention Span: Good  Recall:  Good  Fund of Knowledge:  Good  Language:  Good  Akathisia:  No  Handed:  Right  AIMS (if indicated):     Assets:  Communication Skills Desire for Improvement Housing Resilience Social Support Talents/Skills Transportation  ADL's:  Intact  Cognition:  WNL  Sleep:   ok      Assessment and Plan: Attention deficit disorder, inattentive type.  Patient is stable on Vyvanse 40 mg which he takes Monday to Friday.  He has no issues or concerns.  Recommended to see PCP for annual physical checkup and blood work.  Continue Vyvanse 40 mg Monday to Friday.  Recommended to call us back if is any question or any concern.  Follow-up in 4 months.  Follow Up Instructions:    I discussed the assessment and treatment plan with the patient. The patient was provided an opportunity to ask questions and all were answered. The patient agreed with the plan and demonstrated an understanding of the instructions.   The patient was advised to call back or seek an in-person evaluation if the symptoms worsen  or if the condition fails to improve as anticipated.  Collaboration of Care: Primary Care Provider AEB ask to established care with PCP.  Patient/Guardian was advised Release of Information must be obtained prior to any record release in order to collaborate their care with an outside provider. Patient/Guardian was advised if they have not already done so to contact the registration department to sign all necessary forms in order for [place] to release information regarding their care.   Consent: Patient/Guardian gives verbal consent for treatment and assignment of benefits for services provided during this visit.  Patient/Guardian expressed understanding and agreed to proceed.    I provided 16 minutes of non-face-to-face time during this encounter.   Cleotis Nipper, MD

## 2022-02-05 ENCOUNTER — Telehealth (HOSPITAL_COMMUNITY): Payer: Self-pay | Admitting: *Deleted

## 2022-02-05 NOTE — Telephone Encounter (Signed)
Opened in error

## 2022-02-06 ENCOUNTER — Telehealth (HOSPITAL_COMMUNITY): Payer: Self-pay | Admitting: *Deleted

## 2022-02-06 DIAGNOSIS — F900 Attention-deficit hyperactivity disorder, predominantly inattentive type: Secondary | ICD-10-CM

## 2022-02-06 MED ORDER — LISDEXAMFETAMINE DIMESYLATE 40 MG PO CAPS: 40.0000 mg | ORAL_CAPSULE | ORAL | 0 refills | Status: DC

## 2022-02-06 NOTE — Telephone Encounter (Signed)
Pt called to inform that [facility] [address] is out of [facility] on the Vyvanse. Pt says he was on Ritalin for years, which doesn't work as well, but will use until pharmacy gets generic Vyvanse. Pharmacy does not have an ETA for generic Vyvanse.

## 2022-02-06 NOTE — Telephone Encounter (Signed)
Pt called requesting a refill of the Vyvanse 40 mg last sent to pharmacy on 12/19/21. Pt has an appointment scheduled on 07/09/22. Please review and advise.

## 2022-02-06 NOTE — Telephone Encounter (Signed)
Let me try [facility]. Pretty much everyone is out due to waiting for generic version. Will update.

## 2022-02-06 NOTE — Telephone Encounter (Signed)
I will not recommend to go for Ritalin which did not work. He needs to try a different pharmacy.

## 2022-02-06 NOTE — Telephone Encounter (Signed)
Done

## 2022-02-06 NOTE — Telephone Encounter (Signed)
[facility] does have both generic and brand Vyvanse 40 mg in stock. Pharmacist said that some insurnace is not paying for generic version right now.

## 2022-02-07 ENCOUNTER — Other Ambulatory Visit (HOSPITAL_COMMUNITY): Payer: Self-pay

## 2022-02-07 MED ORDER — LISDEXAMFETAMINE DIMESYLATE 40 MG PO CAPS
40.0000 mg | ORAL_CAPSULE | ORAL | 0 refills | Status: DC
  Filled 2022-02-07: qty 30, 30d supply, fill #0

## 2022-02-07 NOTE — Telephone Encounter (Signed)
Done

## 2022-03-14 ENCOUNTER — Telehealth (HOSPITAL_COMMUNITY): Payer: Self-pay | Admitting: *Deleted

## 2022-03-14 DIAGNOSIS — F900 Attention-deficit hyperactivity disorder, predominantly inattentive type: Secondary | ICD-10-CM

## 2022-03-14 NOTE — Telephone Encounter (Signed)
Pt called requesting a refill of Vyvanse 40 mg QD. Last Rx sent on 02/07/22. Pt has an upcoming appointment on 07/09/22. Please review.

## 2022-03-15 MED ORDER — LISDEXAMFETAMINE DIMESYLATE 40 MG PO CAPS: 40.0000 mg | ORAL_CAPSULE | ORAL | 0 refills | Status: DC

## 2022-03-15 NOTE — Telephone Encounter (Signed)
Done

## 2022-05-02 ENCOUNTER — Telehealth (HOSPITAL_COMMUNITY): Payer: Self-pay | Admitting: *Deleted

## 2022-05-02 DIAGNOSIS — F900 Attention-deficit hyperactivity disorder, predominantly inattentive type: Secondary | ICD-10-CM

## 2022-05-02 MED ORDER — LISDEXAMFETAMINE DIMESYLATE 40 MG PO CAPS: 40.0000 mg | ORAL_CAPSULE | ORAL | 0 refills | Status: DC

## 2022-05-02 NOTE — Telephone Encounter (Signed)
Done

## 2022-05-02 NOTE — Telephone Encounter (Signed)
Pt called requesting refill of Vyvanse 40 mg qd. Last Rx sent on 03/15/22. Pt has an appointment scheduled for 07/09/22. Please review.

## 2022-05-08 ENCOUNTER — Telehealth (HOSPITAL_COMMUNITY): Payer: Self-pay

## 2022-05-08 ENCOUNTER — Other Ambulatory Visit (HOSPITAL_COMMUNITY): Payer: Self-pay

## 2022-05-08 DIAGNOSIS — F900 Attention-deficit hyperactivity disorder, predominantly inattentive type: Secondary | ICD-10-CM

## 2022-05-08 MED ORDER — LISDEXAMFETAMINE DIMESYLATE 40 MG PO CAPS
40.0000 mg | ORAL_CAPSULE | ORAL | 0 refills | Status: DC
  Filled 2022-05-08: qty 30, 30d supply, fill #0

## 2022-05-08 NOTE — Telephone Encounter (Signed)
[facility] he is requesting that you send his Rx to [facility]. [facility], pharmacy updated in chart.  Please advise

## 2022-05-08 NOTE — Telephone Encounter (Signed)
Send to [facility].

## 2022-05-09 ENCOUNTER — Other Ambulatory Visit (HOSPITAL_COMMUNITY): Payer: Self-pay

## 2022-06-25 ENCOUNTER — Other Ambulatory Visit (HOSPITAL_COMMUNITY): Payer: Self-pay | Admitting: Psychiatry

## 2022-06-25 ENCOUNTER — Telehealth (HOSPITAL_COMMUNITY): Payer: Self-pay | Admitting: *Deleted

## 2022-06-25 ENCOUNTER — Other Ambulatory Visit (HOSPITAL_COMMUNITY): Payer: Self-pay

## 2022-06-25 DIAGNOSIS — F900 Attention-deficit hyperactivity disorder, predominantly inattentive type: Secondary | ICD-10-CM

## 2022-06-25 MED ORDER — LISDEXAMFETAMINE DIMESYLATE 40 MG PO CAPS
40.0000 mg | ORAL_CAPSULE | ORAL | 0 refills | Status: DC
  Filled 2022-06-25: qty 30, 30d supply, fill #0

## 2022-06-25 NOTE — Telephone Encounter (Signed)
Pt called requesting a refill of Vyvanse 40 mg QD. Last sent on 05/08/22. Last visit 01/08/22 and upcoming visit scheduled for 07/09/22. Please send to [place].

## 2022-06-25 NOTE — Telephone Encounter (Signed)
Done

## 2022-07-09 ENCOUNTER — Telehealth (HOSPITAL_BASED_OUTPATIENT_CLINIC_OR_DEPARTMENT_OTHER): Payer: No Typology Code available for payment source | Admitting: Psychiatry

## 2022-07-09 ENCOUNTER — Encounter (HOSPITAL_COMMUNITY): Payer: Self-pay | Admitting: Psychiatry

## 2022-07-09 VITALS — Wt 189.0 lb

## 2022-07-09 DIAGNOSIS — F900 Attention-deficit hyperactivity disorder, predominantly inattentive type: Secondary | ICD-10-CM

## 2022-07-09 NOTE — Progress Notes (Signed)
Virtual Visit via Video Note  I connected with Rainer Sayarath on 07/09/22 at  8:20 AM EST by a video enabled telemedicine application and verified that I am speaking with the correct person using two identifiers.  Location: Patient: Work Provider: Biomedical scientist   I discussed the limitations of evaluation and management by telemedicine and the availability of in person appointments. The patient expressed understanding and agreed to proceed.  History of Present Illness: Patient is evaluated by video session.  He is at work.  He reported things are going well and busy at her job.  His attention and focus is good.  He is able to do multitasking.  He has no tremors, shakes, insomnia, palpitation.  He denies any crying or any feeling of hopelessness or depression.  Like to keep his current Vyvanse dose which she takes Monday to Friday.  He is now trying to get appointment with primary care physician to establish care.  He had appointment coming up in April.  His marriage life is going very well.  He likes his job and he is able to finish his work on time.  He denies drinking or using any illegal substances.  He had a good holidays.  His wife's family live close by.  Past Psychiatric History:  H/O ADD since school age.  Tried Ritalin, Strattera, Adderall but these medicine stopped working after a while.  No H/O psychiatric inpatient treatment, mania, psychosis, hallucination.   Psychiatric Specialty Exam: Physical Exam  Review of Systems  Weight 189 lb (85.7 kg).There is no height or weight on file to calculate BMI.  General Appearance: Casual  Eye Contact:  Good  Speech:  Clear and Coherent  Volume:  Normal  Mood:  Euthymic  Affect:  Appropriate  Thought Process:  Goal Directed  Orientation:  Full (Time, Place, and Person)  Thought Content:  WDL  Suicidal Thoughts:  No  Homicidal Thoughts:  No  Memory:  Immediate;   Good Recent;   Good Remote;   Good  Judgement:  Good  Insight:  Good   Psychomotor Activity:  Normal  Concentration:  Concentration: Good and Attention Span: Good  Recall:  Good  Fund of Knowledge:  Good  Language:  Good  Akathisia:  No  Handed:  Right  AIMS (if indicated):     Assets:  Communication Skills Desire for [place] Talents/Skills Transportation  ADL's:  Intact  Cognition:  WNL  Sleep:   ok      Assessment and Plan: Attention deficit disorder, inattentive type.  Patient is stable on Vyvanse 40 mg Monday to Friday.  He has no concerns, side effects, tremors, shakes or any insomnia.  Encouraged to keep appointment with the PCP in April.  Recommended to call us back if is any question or any concern.  He has leftover Vyvanse and he will call us when he need a new prescription.  Follow-up in 4 months.  Follow Up Instructions:    I discussed the assessment and treatment plan with the patient. The patient was provided an opportunity to ask questions and all were answered. The patient agreed with the plan and demonstrated an understanding of the instructions.   The patient was advised to call back or seek an in-person evaluation if the symptoms worsen or if the condition fails to improve as anticipated.  Collaboration of Care: Other provider involved in patient's care AEB notes are available in epic to review.  Patient/Guardian was advised Release of Information must  be obtained prior to any record release in order to collaborate their care with an outside provider. Patient/Guardian was advised if they have not already done so to contact the registration department to sign all necessary forms in order for [place] to release information regarding their care.   Consent: Patient/Guardian gives verbal consent for treatment and assignment of benefits for services provided during this visit. Patient/Guardian expressed understanding and agreed to proceed.    I provided 11 minutes of non-face-to-face time during this  encounter.   Kathlee Nations, MD

## 2022-08-06 ENCOUNTER — Other Ambulatory Visit (HOSPITAL_COMMUNITY): Payer: Self-pay | Admitting: Psychiatry

## 2022-08-06 DIAGNOSIS — F900 Attention-deficit hyperactivity disorder, predominantly inattentive type: Secondary | ICD-10-CM

## 2022-08-07 ENCOUNTER — Other Ambulatory Visit (HOSPITAL_COMMUNITY): Payer: Self-pay

## 2022-08-07 MED ORDER — LISDEXAMFETAMINE DIMESYLATE 40 MG PO CAPS
40.0000 mg | ORAL_CAPSULE | ORAL | 0 refills | Status: DC
  Filled 2022-08-07: qty 30, 30d supply, fill #0

## 2022-09-17 ENCOUNTER — Other Ambulatory Visit (HOSPITAL_COMMUNITY): Payer: Self-pay | Admitting: Psychiatry

## 2022-09-17 DIAGNOSIS — F900 Attention-deficit hyperactivity disorder, predominantly inattentive type: Secondary | ICD-10-CM

## 2022-09-21 ENCOUNTER — Other Ambulatory Visit (HOSPITAL_COMMUNITY): Payer: Self-pay

## 2022-09-21 ENCOUNTER — Telehealth (HOSPITAL_COMMUNITY): Payer: Self-pay | Admitting: *Deleted

## 2022-09-21 DIAGNOSIS — F900 Attention-deficit hyperactivity disorder, predominantly inattentive type: Secondary | ICD-10-CM

## 2022-09-21 MED ORDER — LISDEXAMFETAMINE DIMESYLATE 40 MG PO CAPS
40.0000 mg | ORAL_CAPSULE | ORAL | 0 refills | Status: DC
  Filled 2022-09-21: qty 30, 30d supply, fill #0

## 2022-09-21 MED ORDER — LISDEXAMFETAMINE DIMESYLATE 40 MG PO CAPS: 40.0000 mg | ORAL_CAPSULE | ORAL | 0 refills | Status: DC

## 2022-09-21 NOTE — Telephone Encounter (Signed)
Please send to [facility]

## 2022-09-21 NOTE — Telephone Encounter (Signed)
Pt called requesting a refill of the Vyvanse 40 mg caps #40. Last e-scribed on 08/07/22. Last appointment completed on 07/09/22. Pt has f/u scheduled for 11/07/22. Please send to [facility].

## 2022-09-21 NOTE — Telephone Encounter (Signed)
[facility].

## 2022-09-21 NOTE — Telephone Encounter (Signed)
I sent to [facility] pharmacy

## 2022-09-24 ENCOUNTER — Other Ambulatory Visit (HOSPITAL_COMMUNITY): Payer: Self-pay

## 2022-09-27 ENCOUNTER — Other Ambulatory Visit (HOSPITAL_COMMUNITY): Payer: Self-pay

## 2022-11-07 ENCOUNTER — Other Ambulatory Visit (HOSPITAL_COMMUNITY): Payer: Self-pay

## 2022-11-07 ENCOUNTER — Encounter (HOSPITAL_COMMUNITY): Payer: Self-pay | Admitting: Psychiatry

## 2022-11-07 ENCOUNTER — Telehealth (HOSPITAL_BASED_OUTPATIENT_CLINIC_OR_DEPARTMENT_OTHER): Payer: No Typology Code available for payment source | Admitting: Psychiatry

## 2022-11-07 DIAGNOSIS — F900 Attention-deficit hyperactivity disorder, predominantly inattentive type: Secondary | ICD-10-CM

## 2022-11-07 MED ORDER — LISDEXAMFETAMINE DIMESYLATE 40 MG PO CAPS
40.0000 mg | ORAL_CAPSULE | ORAL | 0 refills | Status: DC
  Filled 2022-11-07: qty 30, 30d supply, fill #0

## 2022-11-07 NOTE — Progress Notes (Signed)
[facility] MD Virtual Progress Note   Patient Location: Work Provider Location: Home Office  I connect with patient by video and verified that I am speaking with correct person by using two identifiers. I discussed the limitations of evaluation and management by telemedicine and the availability of in person appointments. I also discussed with the patient that there may be a patient responsible charge related to this service. The patient expressed understanding and agreed to proceed.  Gabriel Klein [id number] 37 y.o.  11/07/2022 8:28 AM  History of Present Illness:  Patient is evaluated by video session.  He is at work.  He recently to communication from [place].  He enjoyed the time of.  He did hiking with his wife.  He reported job is going well.  He is taking Vyvanse Monday to Friday that helps his focus, attention and multitasking.  Denies any irritability, tremors, shakes or any insomnia.  His energy level is good.  His appetite is okay and his weight is stable.  Now he has established a primary care and his appointment is coming up end of July.  His family life is going well.  A good support from his wife.  He denies any issues at work.  He is able to finish his work on time without getting distracted.  Like to keep the current medicine.  Past Psychiatric History:  H/O ADD since school age.  Tried Ritalin, Strattera, Adderall but these medicine stopped working after a while.  No H/O psychiatric inpatient treatment, mania, psychosis, hallucination.    Outpatient Encounter Medications as of 11/07/2022  Medication Sig   lisdexamfetamine (VYVANSE) 40 MG capsule Take 1 capsule (40 mg total) by mouth every morning.   No facility-administered encounter medications on file as of 11/07/2022.    No results found for this or any previous visit (from the past 2160 hour(s)).   Psychiatric Specialty Exam: Physical Exam  Review of Systems  Weight 189 lb (85.7 kg).There is no  height or weight on file to calculate BMI.  General Appearance: Casual  Eye Contact:  Good  Speech:  Clear and Coherent  Volume:  Normal  Mood:  Euthymic  Affect:  Congruent  Thought Process:  Goal Directed  Orientation:  Full (Time, Place, and Person)  Thought Content:  Logical  Suicidal Thoughts:  No  Homicidal Thoughts:  No  Memory:  Immediate;   Good Recent;   Good Remote;   Good  Judgement:  Good  Insight:  Present  Psychomotor Activity:  Normal  Concentration:  Concentration: Good and Attention Span: Good  Recall:  Good  Fund of Knowledge:  Good  Language:  Good  Akathisia:  No  Handed:  Right  AIMS (if indicated):     Assets:  Communication Skills Desire for Improvement Housing Physical Health Resilience Social Support Talents/Skills Transportation  ADL's:  Intact  Cognition:  WNL  Sleep:  ok     Assessment/Plan: Attention deficit hyperactivity disorder (ADHD), predominantly inattentive type - Plan: lisdexamfetamine (VYVANSE) 40 MG capsule  Patient is stable on current medication.  Continue Vyvanse 40 mg which he takes Monday to Friday.  Patient has appointment with his new PCP coming up end of July.  Recommended to call us back if he has any question or any concern.  Follow-up in 4 months.   Follow Up Instructions:     I discussed the assessment and treatment plan with the patient. The patient was provided an opportunity to ask questions and all were answered.  The patient agreed with the plan and demonstrated an understanding of the instructions.   The patient was advised to call back or seek an in-person evaluation if the symptoms worsen or if the condition fails to improve as anticipated.    Collaboration of Care: Other provider involved in patient's care AEB notes are available in epic to review.  Patient/Guardian was advised Release of Information must be obtained prior to any record release in order to collaborate their care with an outside  provider. Patient/Guardian was advised if they have not already done so to contact the registration department to sign all necessary forms in order for [place] to release information regarding their care.   Consent: Patient/Guardian gives verbal consent for treatment and assignment of benefits for services provided during this visit. Patient/Guardian expressed understanding and agreed to proceed.     I provided 20 minutes of non face to face time during this encounter.  Note: This document was prepared by [employer] voice dictation technology and any errors that results from this process are unintentional.    Cleotis Nipper, MD 11/07/2022

## 2022-12-24 ENCOUNTER — Other Ambulatory Visit (HOSPITAL_COMMUNITY): Payer: Self-pay | Admitting: Psychiatry

## 2022-12-24 DIAGNOSIS — F900 Attention-deficit hyperactivity disorder, predominantly inattentive type: Secondary | ICD-10-CM

## 2023-01-01 ENCOUNTER — Other Ambulatory Visit (HOSPITAL_COMMUNITY): Payer: Self-pay | Admitting: Psychiatry

## 2023-01-01 DIAGNOSIS — F900 Attention-deficit hyperactivity disorder, predominantly inattentive type: Secondary | ICD-10-CM

## 2023-01-08 ENCOUNTER — Other Ambulatory Visit (HOSPITAL_COMMUNITY): Payer: Self-pay

## 2023-01-08 ENCOUNTER — Telehealth (HOSPITAL_COMMUNITY): Payer: Self-pay | Admitting: *Deleted

## 2023-01-08 MED ORDER — LISDEXAMFETAMINE DIMESYLATE 40 MG PO CAPS
40.0000 mg | ORAL_CAPSULE | ORAL | 0 refills | Status: DC
Start: 2023-01-08 — End: 2023-02-19
  Filled 2023-01-08: qty 30, 30d supply, fill #0

## 2023-01-08 NOTE — Telephone Encounter (Signed)
Pt of Dr. Sheela Stack called requesting a refill of the Vyvanse 40 mg caps. Pt last visit was on 11/07/22 and last Rx sent at that time. Pt has a f/u scheduled for 03/06/23. [facility]. Please review. Thank you.

## 2023-01-08 NOTE — Telephone Encounter (Signed)
Vyvanse refill sent while covering for Dr. Lolly Mustache

## 2023-02-15 ENCOUNTER — Other Ambulatory Visit (HOSPITAL_COMMUNITY): Payer: Self-pay | Admitting: Psychiatry

## 2023-02-15 DIAGNOSIS — F900 Attention-deficit hyperactivity disorder, predominantly inattentive type: Secondary | ICD-10-CM

## 2023-02-19 ENCOUNTER — Other Ambulatory Visit (HOSPITAL_COMMUNITY): Payer: Self-pay

## 2023-02-19 ENCOUNTER — Telehealth (HOSPITAL_COMMUNITY): Payer: Self-pay

## 2023-02-19 DIAGNOSIS — F900 Attention-deficit hyperactivity disorder, predominantly inattentive type: Secondary | ICD-10-CM

## 2023-02-19 MED ORDER — LISDEXAMFETAMINE DIMESYLATE 40 MG PO CAPS
40.0000 mg | ORAL_CAPSULE | ORAL | 0 refills | Status: DC
  Filled 2023-02-19: qty 5, 5d supply, fill #0
  Filled 2023-02-20: qty 25, 25d supply, fill #0

## 2023-02-19 NOTE — Telephone Encounter (Signed)
Patient is calling for a refill on his Vyvanse, last filled on 8/13, pt has a follow up in October. Patient uses [facility] pharmacy

## 2023-02-19 NOTE — Telephone Encounter (Signed)
Done

## 2023-02-20 ENCOUNTER — Other Ambulatory Visit (HOSPITAL_COMMUNITY): Payer: Self-pay

## 2023-03-06 ENCOUNTER — Other Ambulatory Visit (HOSPITAL_COMMUNITY): Payer: Self-pay

## 2023-03-06 ENCOUNTER — Encounter (HOSPITAL_COMMUNITY): Payer: Self-pay | Admitting: Psychiatry

## 2023-03-06 ENCOUNTER — Telehealth (HOSPITAL_BASED_OUTPATIENT_CLINIC_OR_DEPARTMENT_OTHER): Payer: No Typology Code available for payment source | Admitting: Psychiatry

## 2023-03-06 DIAGNOSIS — F900 Attention-deficit hyperactivity disorder, predominantly inattentive type: Secondary | ICD-10-CM | POA: Diagnosis not present

## 2023-03-06 MED ORDER — LISDEXAMFETAMINE DIMESYLATE 40 MG PO CAPS
40.0000 mg | ORAL_CAPSULE | ORAL | 0 refills | Status: DC
Start: 2023-03-06 — End: 2023-05-27
  Filled 2023-03-06: qty 30, 30d supply, fill #0
  Filled 2023-04-03: qty 10, 10d supply, fill #0
  Filled 2023-04-03: qty 20, 20d supply, fill #0

## 2023-03-06 NOTE — Progress Notes (Signed)
[facility] MD Virtual Progress Note   Patient Location: Work Provider Location: Home Office  I connect with patient by video and verified that I am speaking with correct person by using two identifiers. I discussed the limitations of evaluation and management by telemedicine and the availability of in person appointments. I also discussed with the patient that there may be a patient responsible charge related to this service. The patient expressed understanding and agreed to proceed.  Gabriel Klein [id number] 37 y.o.  03/06/2023 8:23 AM  History of Present Illness:  Patient is evaluated by video session.  He is taking Vyvanse Monday to Friday and that has been working very well for him.  His attention concentration is good.  He is able to finish his work on time.  Patient told recently he had hide procedure for [place] and recently had a follow-up exam and he is happy with the findings and recovery.  He is sleeping good.  He is excited about upcoming trip to [place] to attend the wedding.  He sleeps good.  He denies any tremors, shakes, palpitation.  He checks his blood pressure every day before he go to work and it has been stable.  His appetite is okay.  His weight is stable.  He goes to gym regularly.  Patient told he has planned to go to [place], [place] to see his wife grandmother who is 107 year old.  Patient has no tremors, shakes or any EPS.  Like to keep his current dose of medication.  Past Psychiatric History: H/O ADD since school age.  Tried Ritalin, Strattera, Adderall but these medicine stopped working after a while.  No H/O psychiatric inpatient treatment, mania, psychosis, hallucination.    Outpatient Encounter Medications as of 03/06/2023  Medication Sig   lisdexamfetamine (VYVANSE) 40 MG capsule Take 1 capsule (40 mg total) by mouth every morning.   No facility-administered encounter medications on file as of 03/06/2023.    No results found for this or  any previous visit (from the past 2160 hour(s)).   Psychiatric Specialty Exam: Physical Exam  Review of Systems  Weight 185 lb (83.9 kg).There is no height or weight on file to calculate BMI.  General Appearance: Casual  Eye Contact:  Good  Speech:  Clear and Coherent and Normal Rate  Volume:  Normal  Mood:  Euthymic  Affect:  Appropriate  Thought Process:  Goal Directed  Orientation:  Full (Time, Place, and Person)  Thought Content:  WDL  Suicidal Thoughts:  No  Homicidal Thoughts:  No  Memory:  Immediate;   Good Recent;   Good Remote;   Good  Judgement:  Good  Insight:  Present  Psychomotor Activity:  Normal  Concentration:  Concentration: Good and Attention Span: Good  Recall:  Good  Fund of Knowledge:  Good  Language:  Good  Akathisia:  No  Handed:  Right  AIMS (if indicated):     Assets:  Communication Skills Desire for Improvement Housing Resilience Social Support Talents/Skills Transportation  ADL's:  Intact  Cognition:  WNL  Sleep:  ok     Assessment/Plan: Attention deficit hyperactivity disorder (ADHD), predominantly inattentive type - Plan: lisdexamfetamine (VYVANSE) 40 MG capsule  Patient is stable on current dose of Vyvanse 40 mg daily.  He has no tremors or shakes or any EPS.  He has no major concern.  He takes Monday to Friday.  Recommended to call us back if is any question or any concern.  Follow-up in 4 months.  Follow Up Instructions:     I discussed the assessment and treatment plan with the patient. The patient was provided an opportunity to ask questions and all were answered. The patient agreed with the plan and demonstrated an understanding of the instructions.   The patient was advised to call back or seek an in-person evaluation if the symptoms worsen or if the condition fails to improve as anticipated.    Collaboration of Care: Other provider involved in patient's care AEB notes are available in epic to review  Patient/Guardian  was advised Release of Information must be obtained prior to any record release in order to collaborate their care with an outside provider. Patient/Guardian was advised if they have not already done so to contact the registration department to sign all necessary forms in order for [place] to release information regarding their care.   Consent: Patient/Guardian gives verbal consent for treatment and assignment of benefits for services provided during this visit. Patient/Guardian expressed understanding and agreed to proceed.     I provided 19 minutes of non face to face time during this encounter.  Note: This document was prepared by [employer] voice dictation technology and any errors that results from this process are unintentional.    Cleotis Nipper, MD 03/06/2023

## 2023-04-03 ENCOUNTER — Other Ambulatory Visit (HOSPITAL_COMMUNITY): Payer: Self-pay

## 2023-05-15 ENCOUNTER — Other Ambulatory Visit (HOSPITAL_COMMUNITY): Payer: Self-pay | Admitting: Psychiatry

## 2023-05-15 DIAGNOSIS — F900 Attention-deficit hyperactivity disorder, predominantly inattentive type: Secondary | ICD-10-CM

## 2023-05-27 ENCOUNTER — Telehealth (HOSPITAL_COMMUNITY): Payer: Self-pay | Admitting: *Deleted

## 2023-05-27 ENCOUNTER — Other Ambulatory Visit (HOSPITAL_COMMUNITY): Payer: Self-pay

## 2023-05-27 DIAGNOSIS — F900 Attention-deficit hyperactivity disorder, predominantly inattentive type: Secondary | ICD-10-CM

## 2023-05-27 MED ORDER — LISDEXAMFETAMINE DIMESYLATE 40 MG PO CAPS
40.0000 mg | ORAL_CAPSULE | ORAL | 0 refills | Status: DC
Start: 2023-05-27 — End: 2023-06-18
  Filled 2023-05-27: qty 30, 30d supply, fill #0

## 2023-05-27 NOTE — Telephone Encounter (Signed)
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 Last visit, and script, was on 03/06/23. Pt has a f/u scheduled for 06/18/23. Please send to [facility].

## 2023-05-27 NOTE — Telephone Encounter (Signed)
PDMP reviewed and bridge script was sent into the pharmacy.

## 2023-06-18 ENCOUNTER — Encounter (HOSPITAL_COMMUNITY): Payer: Self-pay | Admitting: Psychiatry

## 2023-06-18 ENCOUNTER — Telehealth (HOSPITAL_BASED_OUTPATIENT_CLINIC_OR_DEPARTMENT_OTHER): Payer: No Typology Code available for payment source | Admitting: Psychiatry

## 2023-06-18 ENCOUNTER — Other Ambulatory Visit (HOSPITAL_COMMUNITY): Payer: Self-pay

## 2023-06-18 DIAGNOSIS — F900 Attention-deficit hyperactivity disorder, predominantly inattentive type: Secondary | ICD-10-CM | POA: Diagnosis not present

## 2023-06-18 MED ORDER — ONDANSETRON 8 MG PO TBDP
8.0000 mg | ORAL_TABLET | Freq: Four times a day (QID) | ORAL | 0 refills | Status: DC
  Filled 2023-06-18: qty 30, 8d supply, fill #0

## 2023-06-18 MED ORDER — LISDEXAMFETAMINE DIMESYLATE 40 MG PO CAPS
40.0000 mg | ORAL_CAPSULE | ORAL | 0 refills | Status: DC
  Filled 2023-06-18 – 2023-07-08 (×2): qty 30, 30d supply, fill #0

## 2023-06-18 NOTE — Progress Notes (Signed)
[facility] MD Virtual Progress Note   Patient Location: Work Provider Location: Home Office  I connect with patient by video and verified that I am speaking with correct person by using two identifiers. I discussed the limitations of evaluation and management by telemedicine and the availability of in person appointments. I also discussed with the patient that there may be a patient responsible charge related to this service. The patient expressed understanding and agreed to proceed.  Gabriel Klein [id number] 38 y.o.  06/18/2023 8:26 AM  History of Present Illness:  Patient was evaluated by video session.  He is adequate work.  He had a good Christmas.  He did enjoy 2 weeks time off and went to [place] with his wife to visit her parents.  He reported things are going well.  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  He reported attention concentration is good.  He is able to do multitasking and by management.  He does not get distracted.  He is able to finish his task on time.  He continues to go to gym on a regular basis.  Recently he is wife had a rotator cuff tear when she injured her shoulder at gym.  He is sleeping good.  He denies any tremors, shakes, palpitation.  Denies any mania, psychosis.  He sleeps good.  He takes Vyvanse Monday to Friday and he checks his blood pressure regularly.  He is not taking any other medication.  Denies drinking or using any illegal substances.  His appetite is okay.  His weight is stable.  Past Psychiatric History: H/O ADD since school age.  Tried Ritalin, Strattera, Adderall but these medicine stopped working after a while.  No H/O psychiatric inpatient treatment, mania, psychosis, hallucination.    Outpatient Encounter Medications as of 06/18/2023  Medication Sig   lisdexamfetamine (VYVANSE) 40 MG capsule Take 1 capsule (40 mg total) by mouth every morning.   No facility-administered encounter  medications on file as of 06/18/2023.    No results found for this or any previous visit (from the past 2160 hours).   Psychiatric Specialty Exam: Physical Exam  Review of Systems  Weight 185 lb (83.9 kg).There is no height or weight on file to calculate BMI.  General Appearance: Well Groomed  Eye Contact:  Good  Speech:  Clear and Coherent  Volume:  Normal  Mood:  Euthymic  Affect:  Congruent  Thought Process:  Goal Directed  Orientation:  Full (Time, Place, and Person)  Thought Content:  Logical  Suicidal Thoughts:  No  Homicidal Thoughts:  No  Memory:  Immediate;   Good Recent;   Good Remote;   Good  Judgement:  Good  Insight:  Good  Psychomotor Activity:  Normal  Concentration:  Concentration: Good and Attention Span: Good  Recall:  Good  Fund of Knowledge:  Good  Language:  Good  Akathisia:  No  Handed:  Right  AIMS (if indicated):     Assets:  Communication Skills Desire for Improvement Financial Resources/Insurance Housing Resilience Social Support Talents/Skills Transportation  ADL's:  Intact  Cognition:  WNL  Sleep:  ok     Assessment/Plan: Attention deficit hyperactivity disorder (ADHD), predominantly inattentive type - Plan: lisdexamfetamine (VYVANSE) 40 MG capsule  Patient doing fine on the current dose of Vyvanse 40 mg every day.  He has no major concern or side effects.  Discussed stimulant abuse, tolerance, withdrawal.  He checks his blood  pressure every day.  He does not want to change the medication since it is working well.  He takes Monday to Friday his medication.  Recommended to call us back if is any question or any concern.  Follow-up in 4 months.   Follow Up Instructions:     I discussed the assessment and treatment plan with the patient. The patient was provided an opportunity to ask questions and all were answered. The patient agreed with the plan and demonstrated an understanding of the instructions.   The patient was advised to call  back or seek an in-person evaluation if the symptoms worsen or if the condition fails to improve as anticipated.    Collaboration of Care: Other provider involved in patient's care AEB notes are available in epic to review  Patient/Guardian was advised Release of Information must be obtained prior to any record release in order to collaborate their care with an outside provider. Patient/Guardian was advised if they have not already done so to contact the registration department to sign all necessary forms in order for [place] to release information regarding their care.   Consent: Patient/Guardian gives verbal consent for treatment and assignment of benefits for services provided during this visit. Patient/Guardian expressed understanding and agreed to proceed.     I provided 16 minutes of non face to face time during this encounter.  Note: This document was prepared by [employer] voice dictation technology and any errors that results from this process are unintentional.    Cleotis Nipper, MD 06/18/2023

## 2023-07-08 ENCOUNTER — Other Ambulatory Visit: Payer: Self-pay

## 2023-07-08 ENCOUNTER — Other Ambulatory Visit (HOSPITAL_COMMUNITY): Payer: Self-pay

## 2023-08-20 ENCOUNTER — Other Ambulatory Visit (HOSPITAL_COMMUNITY): Payer: Self-pay | Admitting: Psychiatry

## 2023-08-20 DIAGNOSIS — F900 Attention-deficit hyperactivity disorder, predominantly inattentive type: Secondary | ICD-10-CM

## 2023-08-26 ENCOUNTER — Telehealth (HOSPITAL_COMMUNITY): Payer: Self-pay | Admitting: *Deleted

## 2023-08-26 DIAGNOSIS — F900 Attention-deficit hyperactivity disorder, predominantly inattentive type: Secondary | ICD-10-CM

## 2023-08-26 NOTE — Telephone Encounter (Signed)
 Pt called to request a refill of the Vyvanse 40 mg caps. Pt last visit wa son 06/18/23 and medication e-scribed at that time. F/u scheduled for 10/22/23. Please review.

## 2023-08-27 ENCOUNTER — Other Ambulatory Visit (HOSPITAL_COMMUNITY): Payer: Self-pay

## 2023-08-27 MED ORDER — LISDEXAMFETAMINE DIMESYLATE 40 MG PO CAPS
40.0000 mg | ORAL_CAPSULE | ORAL | 0 refills | Status: DC
  Filled 2023-08-27: qty 30, 30d supply, fill #0

## 2023-08-27 NOTE — Telephone Encounter (Signed)
 Done

## 2023-08-30 ENCOUNTER — Other Ambulatory Visit (HOSPITAL_COMMUNITY): Payer: Self-pay

## 2023-08-31 ENCOUNTER — Other Ambulatory Visit (HOSPITAL_COMMUNITY): Payer: Self-pay

## 2023-10-22 ENCOUNTER — Telehealth (HOSPITAL_COMMUNITY): Payer: No Typology Code available for payment source | Admitting: Psychiatry

## 2023-10-22 ENCOUNTER — Other Ambulatory Visit (HOSPITAL_COMMUNITY): Payer: Self-pay

## 2023-10-22 ENCOUNTER — Encounter (HOSPITAL_COMMUNITY): Payer: Self-pay | Admitting: Psychiatry

## 2023-10-22 DIAGNOSIS — F900 Attention-deficit hyperactivity disorder, predominantly inattentive type: Secondary | ICD-10-CM

## 2023-10-22 MED ORDER — LISDEXAMFETAMINE DIMESYLATE 40 MG PO CAPS
40.0000 mg | ORAL_CAPSULE | ORAL | 0 refills | Status: DC
Start: 2023-10-22 — End: 2023-11-28
  Filled 2023-10-22: qty 30, 30d supply, fill #0

## 2023-10-22 NOTE — Progress Notes (Signed)
 [facility] MD Virtual Progress Note   Patient Location: Home Provider Location: Home Office  I connect with patient by video and verified that I am speaking with correct person by using two identifiers. I discussed the limitations of evaluation and management by telemedicine and the availability of in person appointments. I also discussed with the patient that there may be a patient responsible charge related to this service. The patient expressed understanding and agreed to proceed.  Gabriel Klein [id number] 38 y.o.  10/22/2023 10:02 AM  History of Present Illness:  Patient is evaluated by video session.  He is stable on Vyvanse .  He reported attention concentration is good.  He is able to do multitasking and productive at work.  Recently had a trip to [place] to celebrate his 73th birthday with his wife and friends.  He really had a good time.  His appetite is okay.  His weight is stable.  He denies any insomnia, anxiety, panic attack.  He denies drinking or using any illegal substances.  He like to keep the current dose of Vyvanse  which is keeping his ADHD symptoms stable.  Past Psychiatric History: H/O ADD since school age.  Tried Ritalin , Strattera, Adderall but these medicine stopped working after a while.  No H/O psychiatric inpatient treatment, mania, psychosis, hallucination.    Outpatient Encounter Medications as of 10/22/2023  Medication Sig   lisdexamfetamine (VYVANSE ) 40 MG capsule Take 1 capsule (40 mg total) by mouth every morning.   ondansetron  (ZOFRAN -ODT) 8 MG disintegrating tablet Dissolve 1 tablet (8 mg total) by mouth every 6 (six) hours.   No facility-administered encounter medications on file as of 10/22/2023.    No results found for this or any previous visit (from the past 2160 hours).   Psychiatric Specialty Exam: Physical Exam  Review of Systems  Weight 185 lb (83.9 kg).There is no height or weight on file to calculate BMI.  General Appearance:  Casual  Eye Contact:  Good  Speech:  Clear and Coherent and Normal Rate  Volume:  Normal  Mood:  Euthymic  Affect:  Appropriate  Thought Process:  Goal Directed  Orientation:  Full (Time, Place, and Person)  Thought Content:  WDL  Suicidal Thoughts:  No  Homicidal Thoughts:  No  Memory:  Immediate;   Good Recent;   Good Remote;   Good  Judgement:  Good  Insight:  Good  Psychomotor Activity:  Normal  Concentration:  Concentration: Good and Attention Span: Good  Recall:  Good  Fund of Knowledge:  Good  Language:  Good  Akathisia:  No  Handed:  Right  AIMS (if indicated):     Assets:  Communication Skills Desire for Improvement Financial Resources/Insurance Housing Social Support Talents/Skills Transportation  ADL's:  Intact  Cognition:  WNL  Sleep:  ok        No data to display          Assessment/Plan: Attention deficit hyperactivity disorder (ADHD), predominantly inattentive type - Plan: lisdexamfetamine (VYVANSE ) 40 MG capsule  Patient is a stable on current medication Vyvanse  40 mg which she takes most of the days when he work.  Does not take on the weekends.  He is aware about stimulant abuse, tolerance withdrawal and dependency.  He checked his blood pressure and his weight regularly.  He has no major concern or side effects from the medication.  Continue Vyvanse  40 mg daily.  Recommend to call us  back if is any question or any concern.  Follow-up in  4 months.   Follow Up Instructions:     I discussed the assessment and treatment plan with the patient. The patient was provided an opportunity to ask questions and all were answered. The patient agreed with the plan and demonstrated an understanding of the instructions.   The patient was advised to call back or seek an in-person evaluation if the symptoms worsen or if the condition fails to improve as anticipated.    Collaboration of Care: Other provider involved in patient's care AEB notes are available in  epic to review  Patient/Guardian was advised Release of Information must be obtained prior to any record release in order to collaborate their care with an outside provider. Patient/Guardian was advised if they have not already done so to contact the registration department to sign all necessary forms in order for us  to release information regarding their care.   Consent: Patient/Guardian gives verbal consent for treatment and assignment of benefits for services provided during this visit. Patient/Guardian expressed understanding and agreed to proceed.     Total encounter time 16 minutes which includes face-to-face time, chart reviewed, care coordination, order entry and documentation during this encounter.   Note: This document was prepared by [employer] voice dictation technology and any errors that results from this process are unintentional.    Arturo Late, MD 10/22/2023

## 2023-11-28 ENCOUNTER — Other Ambulatory Visit (HOSPITAL_COMMUNITY): Payer: Self-pay

## 2023-11-28 ENCOUNTER — Telehealth (HOSPITAL_COMMUNITY): Payer: Self-pay | Admitting: *Deleted

## 2023-11-28 DIAGNOSIS — F900 Attention-deficit hyperactivity disorder, predominantly inattentive type: Secondary | ICD-10-CM

## 2023-11-28 MED ORDER — LISDEXAMFETAMINE DIMESYLATE 40 MG PO CAPS
40.0000 mg | ORAL_CAPSULE | ORAL | 0 refills | Status: DC
  Filled 2023-11-28: qty 30, 30d supply, fill #0

## 2023-11-28 NOTE — Telephone Encounter (Signed)
 Pt called requesting a refill of the Vyvanse  40 mg. Last e-scribed during last visit on 10/22/23. Pt has f/u scheduled for 02/18/24.

## 2023-11-28 NOTE — Telephone Encounter (Signed)
 Done

## 2024-01-09 ENCOUNTER — Other Ambulatory Visit (HOSPITAL_COMMUNITY): Payer: Self-pay

## 2024-01-09 MED ORDER — VALACYCLOVIR HCL 1 G PO TABS
1.0000 g | ORAL_TABLET | Freq: Two times a day (BID) | ORAL | 0 refills | Status: AC
  Filled 2024-01-09: qty 14, 7d supply, fill #0

## 2024-01-20 ENCOUNTER — Telehealth (HOSPITAL_COMMUNITY): Payer: Self-pay

## 2024-01-20 DIAGNOSIS — F900 Attention-deficit hyperactivity disorder, predominantly inattentive type: Secondary | ICD-10-CM

## 2024-01-20 NOTE — Telephone Encounter (Signed)
 Patient is calling for a refill on his Vyvanse  last sent in July. Please review and advise, thank you

## 2024-01-21 ENCOUNTER — Other Ambulatory Visit (HOSPITAL_COMMUNITY): Payer: Self-pay | Admitting: Psychiatry

## 2024-01-21 ENCOUNTER — Other Ambulatory Visit (HOSPITAL_COMMUNITY): Payer: Self-pay

## 2024-01-21 DIAGNOSIS — F900 Attention-deficit hyperactivity disorder, predominantly inattentive type: Secondary | ICD-10-CM

## 2024-01-21 MED ORDER — LISDEXAMFETAMINE DIMESYLATE 40 MG PO CAPS
40.0000 mg | ORAL_CAPSULE | ORAL | 0 refills | Status: DC
  Filled 2024-01-21: qty 30, 30d supply, fill #0

## 2024-01-21 NOTE — Telephone Encounter (Signed)
 Done

## 2024-02-18 ENCOUNTER — Other Ambulatory Visit (HOSPITAL_COMMUNITY): Payer: Self-pay

## 2024-02-18 ENCOUNTER — Telehealth (HOSPITAL_COMMUNITY): Admitting: Psychiatry

## 2024-02-18 ENCOUNTER — Encounter (HOSPITAL_COMMUNITY): Payer: Self-pay | Admitting: Psychiatry

## 2024-02-18 DIAGNOSIS — F900 Attention-deficit hyperactivity disorder, predominantly inattentive type: Secondary | ICD-10-CM | POA: Diagnosis not present

## 2024-02-18 MED ORDER — LISDEXAMFETAMINE DIMESYLATE 40 MG PO CAPS
40.0000 mg | ORAL_CAPSULE | ORAL | 0 refills | Status: DC
  Filled 2024-02-21: qty 30, 30d supply, fill #0

## 2024-02-18 NOTE — Progress Notes (Signed)
 [facility] MD Virtual Progress Note   Patient Location: Work Provider Location: Home Office  I connect with patient by video and verified that I am speaking with correct person by using two identifiers. I discussed the limitations of evaluation and management by telemedicine and the availability of in person appointments. I also discussed with the patient that there may be a patient responsible charge related to this service. The patient expressed understanding and agreed to proceed.  Gabriel Klein [id number] 38 y.o.  02/18/2024 8:09 AM  History of Present Illness:  Patient is evaluated by video session.  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  He also reported father diagnosed with a stage III lung cancer and going to start very soon chemotherapy and radiation.  He had a good support from his family.  He reported for past few months things are okay and he went to [place]  with his wife and had a good time.  He continues to go to gym and do exercise.  His appetite is okay.  His weight is stable.  Denies any crying spells, feeling of hopelessness or worthlessness.  Denies any panic attack.  He has no tremor or shakes or any EPS.  He is able to do multitasking.  He reported not getting distracted or fidgety and able to finish his task on time.  He reported relationship with wife is going very well.  His wife is working as a Geophysical data processor and lately very busy.  He is not taking any other medication.  He admitted not has seen blood work in a while but like to follow-up with the primary care for physical and annual blood work.  Denies drinking or using any illegal substances.  He like to keep his current dose of Vyvanse .  Past Psychiatric History: H/O ADD since school age.  Tried Ritalin , Strattera, Adderall but these medicine stopped working after a while.  No H/O psychiatric inpatient treatment, mania,  psychosis, hallucination.   Past Medical History:  Diagnosis Date   ADHD (attention deficit hyperactivity disorder)     Outpatient Encounter Medications as of 02/18/2024  Medication Sig   lisdexamfetamine (VYVANSE ) 40 MG capsule Take 1 capsule (40 mg total) by mouth every morning.   ondansetron  (ZOFRAN -ODT) 8 MG disintegrating tablet Dissolve 1 tablet (8 mg total) by mouth every 6 (six) hours.   valACYclovir  (VALTREX ) 1000 MG tablet Take 1 tablet (1,000 mg total) by mouth 2 (two) times daily.   No facility-administered encounter medications on file as of 02/18/2024.    No results found for this or any previous visit (from the past 2160 hours).   Psychiatric Specialty Exam: Physical Exam  Review of Systems  Weight 188 lb (85.3 kg).There is no height or weight on file to calculate BMI.  General Appearance: Casual  Eye Contact:  Good  Speech:  Normal Rate  Volume:  Normal  Mood:  Anxious  Affect:  Appropriate  Thought Process:  Goal Directed  Orientation:  Full (Time, Place, and Person)  Thought Content:  Logical  Suicidal Thoughts:  No  Homicidal Thoughts:  No  Memory:  Immediate;   Good Recent;   Good Remote;   Good  Judgement:  Good  Insight:  Good  Psychomotor Activity:  Normal  Concentration:  Concentration: Good and Attention Span: Good  Recall:  Good  Fund of Knowledge:  Good  Language:  Good  Akathisia:  No  Handed:  Right  AIMS (if indicated):     Assets:  Communication Skills Desire for Improvement Housing Resilience Social Support Talents/Skills Transportation  ADL's:  Intact  Cognition:  WNL  Sleep:  ok       02/18/2024    8:16 AM  Depression screen PHQ 2/9  Decreased Interest 0  Down, Depressed, Hopeless 0  PHQ - 2 Score 0    Assessment/Plan: Attention deficit hyperactivity disorder (ADHD), predominantly inattentive type - Plan: lisdexamfetamine (VYVANSE ) 40 MG capsule  Patient is stable on Vyvanse  40 mg.  He has no tremor or shakes or any  EPS.  Discussed to have blood work and annual physical checkup.  Patient is taking this medication for a while and does not ask for early refills.  Discussed stimulant abuse, tolerance, withdrawal.  Recommend to call back if is any question or any concern.  Follow-up in 4 months.   Follow Up Instructions:     I discussed the assessment and treatment plan with the patient. The patient was provided an opportunity to ask questions and all were answered. The patient agreed with the plan and demonstrated an understanding of the instructions.   The patient was advised to call back or seek an in-person evaluation if the symptoms worsen or if the condition fails to improve as anticipated.    Collaboration of Care: Other provider involved in patient's care AEB notes are available in epic to review  Patient/Guardian was advised Release of Information must be obtained prior to any record release in order to collaborate their care with an outside provider. Patient/Guardian was advised if they have not already done so to contact the registration department to sign all necessary forms in order for us  to release information regarding their care.   Consent: Patient/Guardian gives verbal consent for treatment and assignment of benefits for services provided during this visit. Patient/Guardian expressed understanding and agreed to proceed.     Total encounter time 12 minutes which includes face-to-face time, chart reviewed, care coordination, order entry and documentation during this encounter.   Note: This document was prepared by [employer] voice dictation technology and any errors that results from this process are unintentional.    Leni ONEIDA Client, MD 02/18/2024

## 2024-02-21 ENCOUNTER — Other Ambulatory Visit (HOSPITAL_COMMUNITY): Payer: Self-pay

## 2024-04-16 ENCOUNTER — Other Ambulatory Visit (HOSPITAL_COMMUNITY): Payer: Self-pay | Admitting: Psychiatry

## 2024-04-16 DIAGNOSIS — F900 Attention-deficit hyperactivity disorder, predominantly inattentive type: Secondary | ICD-10-CM

## 2024-04-20 ENCOUNTER — Other Ambulatory Visit (HOSPITAL_COMMUNITY): Payer: Self-pay

## 2024-04-20 ENCOUNTER — Telehealth (HOSPITAL_COMMUNITY): Payer: Self-pay

## 2024-04-20 DIAGNOSIS — F900 Attention-deficit hyperactivity disorder, predominantly inattentive type: Secondary | ICD-10-CM

## 2024-04-20 MED ORDER — LISDEXAMFETAMINE DIMESYLATE 40 MG PO CAPS
40.0000 mg | ORAL_CAPSULE | ORAL | 0 refills | Status: DC
  Filled 2024-04-20: qty 30, 30d supply, fill #0

## 2024-04-20 NOTE — Addendum Note (Signed)
 Addended by: CURRY PATERSON T on: 04/20/2024 03:43 PM   Modules accepted: Orders

## 2024-04-20 NOTE — Telephone Encounter (Signed)
 Patient is calling for a refill on Vyvanse , last filled 02/21/2024. Patient has a follow up on 06/16/2024. Please review and advise, thank you

## 2024-04-20 NOTE — Telephone Encounter (Signed)
 Prescription sent to the pharmacy.  Please inform the patient.  Thank you.

## 2024-04-21 NOTE — Telephone Encounter (Signed)
Called patient and let him know his prescription was sent.

## 2024-06-10 ENCOUNTER — Other Ambulatory Visit (HOSPITAL_COMMUNITY): Payer: Self-pay | Admitting: Psychiatry

## 2024-06-10 DIAGNOSIS — F900 Attention-deficit hyperactivity disorder, predominantly inattentive type: Secondary | ICD-10-CM

## 2024-06-11 ENCOUNTER — Telehealth (HOSPITAL_COMMUNITY): Payer: Self-pay | Admitting: *Deleted

## 2024-06-11 ENCOUNTER — Other Ambulatory Visit (HOSPITAL_COMMUNITY): Payer: Self-pay

## 2024-06-11 DIAGNOSIS — F900 Attention-deficit hyperactivity disorder, predominantly inattentive type: Secondary | ICD-10-CM

## 2024-06-11 MED ORDER — LISDEXAMFETAMINE DIMESYLATE 40 MG PO CAPS
40.0000 mg | ORAL_CAPSULE | ORAL | 0 refills | Status: DC
  Filled 2024-06-11: qty 30, 30d supply, fill #0

## 2024-06-11 NOTE — Telephone Encounter (Signed)
 Pt requests refill of Vyvanse  40 mg. Please send to [facility].   Last visit: 02/18/24 Next visit: 06/16/24

## 2024-06-11 NOTE — Telephone Encounter (Signed)
 Prescription sent to the pharmacy.  Please inform the patient.  Thank you.

## 2024-06-12 ENCOUNTER — Other Ambulatory Visit (HOSPITAL_COMMUNITY): Payer: Self-pay

## 2024-06-16 ENCOUNTER — Other Ambulatory Visit (HOSPITAL_COMMUNITY): Payer: Self-pay

## 2024-06-16 ENCOUNTER — Telehealth (HOSPITAL_COMMUNITY): Admitting: Psychiatry

## 2024-06-16 ENCOUNTER — Encounter (HOSPITAL_COMMUNITY): Payer: Self-pay | Admitting: Psychiatry

## 2024-06-16 VITALS — Wt 186.0 lb

## 2024-06-16 DIAGNOSIS — F900 Attention-deficit hyperactivity disorder, predominantly inattentive type: Secondary | ICD-10-CM

## 2024-06-16 MED ORDER — LISDEXAMFETAMINE DIMESYLATE 40 MG PO CAPS: 40.0000 mg | ORAL_CAPSULE | ORAL | 0 refills | Status: AC

## 2024-06-16 NOTE — Progress Notes (Signed)
 " [facility] MD Virtual Progress Note   Patient Location: Work Provider Location: Home Office  I connect with patient by video and verified that I am speaking with correct person by using two identifiers. I discussed the limitations of evaluation and management by telemedicine and the availability of in person appointments. I also discussed with the patient that there may be a patient responsible charge related to this service. The patient expressed understanding and agreed to proceed.  [place] [id number] 39 y.o.  06/16/2024 8:25 AM  History of Present Illness:  Patient is evaluated by video session.  Reported things are going well.  He had a good Christmas.  He reported spending time with more than 68-year-old niece was joyful.  He reported father had treatment and so far tolerating very well and he is pleased with the current condition.  He reported job is going well and he is able to do multitasking.  He denies any irritability, anger, tremor or shakes or any insomnia.  He continues to go for hiking when weather is good.  He also goes to gym and do exercises on a regular basis.  Denies any mania or psychosis.  Reported family life is going very well.  His wife is a geophysical data processor and so far things are going okay.  He denies drinking or using any illegal substances.  He reported with the help of medicine able to finish his task on time and does not get distracted.  He takes Vyvanse  Monday to Friday.  Patient is scheduled to see his new PCP end of next month.  He is going to have a physical and blood work.  Past Psychiatric History: H/O ADD since school age.  Tried Ritalin , Strattera, Adderall but these medicine stopped working after a while.  No H/O psychiatric inpatient treatment, mania, psychosis, hallucination.   Past Medical History:  Diagnosis Date   ADHD (attention deficit hyperactivity disorder)     Outpatient Encounter Medications as of 06/16/2024  Medication Sig    lisdexamfetamine  (VYVANSE ) 40 MG capsule Take 1 capsule (40 mg total) by mouth every morning.   valACYclovir  (VALTREX ) 1000 MG tablet Take 1 tablet (1,000 mg total) by mouth 2 (two) times daily.   No facility-administered encounter medications on file as of 06/16/2024.    No results found for this or any previous visit (from the past 2160 hours).   Psychiatric Specialty Exam: Physical Exam  Review of Systems  Weight 186 lb (84.4 kg).There is no height or weight on file to calculate BMI.  General Appearance: Casual  Eye Contact:  Good  Speech:  Normal Rate  Volume:  Normal  Mood:  Euthymic  Affect:  Appropriate  Thought Process:  Goal Directed  Orientation:  Full (Time, Place, and Person)  Thought Content:  Logical  Suicidal Thoughts:  No  Homicidal Thoughts:  No  Memory:  Immediate;   Good Recent;   Good Remote;   Good  Judgement:  Good  Insight:  Good  Psychomotor Activity:  Normal  Concentration:  Concentration: Good and Attention Span: Good  Recall:  Good  Fund of Knowledge:  Good  Language:  Good  Akathisia:  No  Handed:  Right  AIMS (if indicated):     Assets:  Communication Skills Desire for Improvement Housing Resilience Social Support Talents/Skills Transportation  ADL's:  Intact  Cognition:  WNL  Sleep:  ok       02/18/2024    8:16 AM  Depression screen PHQ 2/9  Decreased Interest 0  Down, Depressed, Hopeless 0  PHQ - 2 Score 0    Assessment/Plan: Attention deficit hyperactivity disorder (ADHD), predominantly inattentive type - Plan: lisdexamfetamine  (VYVANSE ) 40 MG capsule  Patient is 39 year old married employed man with ADHD, predominantly inattentive type.  Currently stable on current dose of Vyvanse  which takes Monday to Friday.  No major concern or side effects.  Denies any insomnia, heart palpitation.  He is taking Monday to Friday.  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  Recommend to call back if is any question or  any concern.  Follow-up in 4 months.  Encouraged to keep appointment with the PCP which is coming up end of next month.   Follow Up Instructions:     I discussed the assessment and treatment plan with the patient. The patient was provided an opportunity to ask questions and all were answered. The patient agreed with the plan and demonstrated an understanding of the instructions.   The patient was advised to call back or seek an in-person evaluation if the symptoms worsen or if the condition fails to improve as anticipated.    Collaboration of Care: Other provider involved in patient's care AEB notes are available in epic to review  Patient/Guardian was advised Release of Information must be obtained prior to any record release in order to collaborate their care with an outside provider. Patient/Guardian was advised if they have not already done so to contact the registration department to sign all necessary forms in order for us  to release information regarding their care.   Consent: Patient/Guardian gives verbal consent for treatment and assignment of benefits for services provided during this visit. Patient/Guardian expressed understanding and agreed to proceed.     Total encounter time 13 minutes which includes face-to-face time, chart reviewed, care coordination, order entry and documentation during this encounter.   Note: This document was prepared by [employer] voice dictation technology and any errors that results from this process are unintentional.    Leni ONEIDA Client, MD 06/16/2024   "

## 2024-07-14 ENCOUNTER — Telehealth (HOSPITAL_COMMUNITY): Admitting: Psychiatry

## 2024-10-13 ENCOUNTER — Telehealth (HOSPITAL_COMMUNITY): Admitting: Psychiatry
